# Patient Record
Sex: Female | Born: 1970 | Race: White | Hispanic: No | Marital: Married | Smoking: Never smoker
Health system: Southern US, Community
[De-identification: ages and names within clinical notes are randomized; demographics above are authoritative.]

## PROBLEM LIST (undated history)

## (undated) DIAGNOSIS — Z87442 Personal history of urinary calculi: Secondary | ICD-10-CM

## (undated) DIAGNOSIS — G43909 Migraine, unspecified, not intractable, without status migrainosus: Secondary | ICD-10-CM

## (undated) HISTORY — PX: OTHER SURGICAL HISTORY: SHX169

## (undated) HISTORY — DX: Migraine, unspecified, not intractable, without status migrainosus: G43.909

## (undated) HISTORY — PX: TONSILLECTOMY: SUR1361

---

## 2006-11-04 ENCOUNTER — Ambulatory Visit: Payer: Self-pay

## 2006-11-05 ENCOUNTER — Ambulatory Visit: Payer: Self-pay

## 2007-05-26 ENCOUNTER — Ambulatory Visit: Payer: Self-pay

## 2008-05-02 ENCOUNTER — Emergency Department: Payer: Self-pay | Admitting: Emergency Medicine

## 2011-04-28 ENCOUNTER — Emergency Department: Payer: Self-pay | Admitting: Emergency Medicine

## 2011-05-17 ENCOUNTER — Emergency Department: Payer: Self-pay | Admitting: *Deleted

## 2011-07-23 ENCOUNTER — Emergency Department: Payer: Self-pay | Admitting: Emergency Medicine

## 2012-10-21 ENCOUNTER — Emergency Department: Payer: Self-pay | Admitting: Emergency Medicine

## 2012-10-21 LAB — RAPID INFLUENZA A&B ANTIGENS

## 2015-07-27 ENCOUNTER — Other Ambulatory Visit: Payer: Self-pay | Admitting: Obstetrics and Gynecology

## 2015-07-27 DIAGNOSIS — Z1231 Encounter for screening mammogram for malignant neoplasm of breast: Secondary | ICD-10-CM

## 2015-08-15 ENCOUNTER — Ambulatory Visit
Admission: RE | Admit: 2015-08-15 | Discharge: 2015-08-15 | Disposition: A | Discharge status: Home / Self Care | Payer: BLUE CROSS/BLUE SHIELD | Source: Ambulatory Visit | Attending: Obstetrics and Gynecology | Admitting: Obstetrics and Gynecology

## 2015-08-15 DIAGNOSIS — Z1231 Encounter for screening mammogram for malignant neoplasm of breast: Secondary | ICD-10-CM

## 2015-11-29 ENCOUNTER — Other Ambulatory Visit: Payer: Self-pay | Admitting: Obstetrics and Gynecology

## 2015-11-29 DIAGNOSIS — M533 Sacrococcygeal disorders, not elsewhere classified: Secondary | ICD-10-CM

## 2015-12-06 ENCOUNTER — Other Ambulatory Visit: Payer: Self-pay | Admitting: Family Medicine

## 2015-12-06 DIAGNOSIS — M5441 Lumbago with sciatica, right side: Secondary | ICD-10-CM

## 2015-12-06 DIAGNOSIS — M533 Sacrococcygeal disorders, not elsewhere classified: Secondary | ICD-10-CM

## 2015-12-07 ENCOUNTER — Other Ambulatory Visit: Payer: Self-pay | Admitting: Obstetrics and Gynecology

## 2015-12-07 DIAGNOSIS — R102 Pelvic and perineal pain: Secondary | ICD-10-CM

## 2015-12-10 ENCOUNTER — Ambulatory Visit
Admission: RE | Admit: 2015-12-10 | Discharge: 2015-12-10 | Disposition: A | Discharge status: Home / Self Care | Payer: BLUE CROSS/BLUE SHIELD | Source: Ambulatory Visit | Attending: Obstetrics and Gynecology | Admitting: Obstetrics and Gynecology

## 2015-12-10 DIAGNOSIS — D259 Leiomyoma of uterus, unspecified: Secondary | ICD-10-CM | POA: Diagnosis not present

## 2015-12-10 DIAGNOSIS — R102 Pelvic and perineal pain: Secondary | ICD-10-CM | POA: Insufficient documentation

## 2015-12-11 ENCOUNTER — Ambulatory Visit (HOSPITAL_COMMUNITY): Payer: BLUE CROSS/BLUE SHIELD

## 2015-12-11 ENCOUNTER — Other Ambulatory Visit (HOSPITAL_COMMUNITY): Payer: BLUE CROSS/BLUE SHIELD

## 2015-12-17 ENCOUNTER — Ambulatory Visit: Payer: BLUE CROSS/BLUE SHIELD

## 2015-12-17 ENCOUNTER — Ambulatory Visit (HOSPITAL_COMMUNITY): Payer: BLUE CROSS/BLUE SHIELD

## 2015-12-18 ENCOUNTER — Other Ambulatory Visit (HOSPITAL_COMMUNITY): Payer: Self-pay | Admitting: Obstetrics and Gynecology

## 2015-12-18 ENCOUNTER — Ambulatory Visit (HOSPITAL_COMMUNITY): Payer: BLUE CROSS/BLUE SHIELD

## 2015-12-18 ENCOUNTER — Ambulatory Visit (HOSPITAL_COMMUNITY)
Admission: RE | Admit: 2015-12-18 | Discharge: 2015-12-18 | Disposition: A | Discharge status: Home / Self Care | Payer: BLUE CROSS/BLUE SHIELD | Source: Ambulatory Visit | Attending: Family Medicine | Admitting: Family Medicine

## 2015-12-18 DIAGNOSIS — M5126 Other intervertebral disc displacement, lumbar region: Secondary | ICD-10-CM | POA: Diagnosis not present

## 2015-12-18 DIAGNOSIS — M5127 Other intervertebral disc displacement, lumbosacral region: Secondary | ICD-10-CM | POA: Diagnosis not present

## 2015-12-18 DIAGNOSIS — M5441 Lumbago with sciatica, right side: Secondary | ICD-10-CM

## 2015-12-18 DIAGNOSIS — R102 Pelvic and perineal pain: Secondary | ICD-10-CM | POA: Insufficient documentation

## 2015-12-18 DIAGNOSIS — M533 Sacrococcygeal disorders, not elsewhere classified: Secondary | ICD-10-CM

## 2015-12-26 ENCOUNTER — Ambulatory Visit: Payer: BLUE CROSS/BLUE SHIELD

## 2016-01-02 ENCOUNTER — Encounter: Payer: Self-pay | Admitting: Physical Therapy

## 2016-01-02 ENCOUNTER — Ambulatory Visit: Payer: BLUE CROSS/BLUE SHIELD | Attending: Family Medicine | Admitting: Physical Therapy

## 2016-01-02 DIAGNOSIS — M7989 Other specified soft tissue disorders: Secondary | ICD-10-CM | POA: Insufficient documentation

## 2016-01-02 DIAGNOSIS — R279 Unspecified lack of coordination: Secondary | ICD-10-CM

## 2016-01-02 DIAGNOSIS — M6281 Muscle weakness (generalized): Secondary | ICD-10-CM | POA: Diagnosis not present

## 2016-01-02 NOTE — Patient Instructions (Addendum)
    Increasing water from 1 glass to 2 glasses a day this week  Body mechanics with house hold chores and log rollign out of bed to decrease strain on back (handout)

## 2016-01-03 NOTE — Therapy (Signed)
[place] MAIN [facility] SERVICES [address] [place], [place], [address] Phone: [phone]   Fax:  [phone]  Physical Therapy Evaluation  Patient Details  Name: Autumn Chang MRN: [id number] Date of Birth: [birth year] Referring Provider: Netty Starring  Encounter Date: 01/02/2016      PT End of Session - 01/02/16 1459    Visit Number 1   Number of Visits 12   Date for PT Re-Evaluation 03/26/16   PT Start Time 1300   PT Stop Time 1402   PT Time Calculation (min) 62 min   Activity Tolerance Patient tolerated treatment well;No increased pain   Behavior During Therapy [facility] for tasks assessed/performed      Past Medical History  Diagnosis Date  . Migraines     Past Surgical History  Procedure Laterality Date  . Cesarean section  1999, 2004    2 c-sections   . Toncillectomy      There were no vitals filed for this visit.       Subjective Assessment - 01/03/16 2212    Subjective 1) Pt reports she had a fall flat on her back from stairs in Sept 2016 without noted injury at the time. In Nov 2016, pt started to experience worsening of LBP and she was not able to differentiate between LBP or her period pain.The pain limited pt from activities such as lifting laundry, standing to do dishes 8/10 with radiating pain to back of calf  at times. This level of pain decreased 0/10 after pt started taking prednisone 2 weeks ago and pt has been able to sleep better and return to household activities. MRI finding 2 weeks ago showed "slipped disk" L5 and x-ray showed "arthritis".  2) Pelvic foor dysfunction: constipation every 3 days with Stool type 4.  Denied straining.  Pt noticed more constipation with use of steriod medications. Pt states she is "not drinking enough water". Pt drinks 1 glass of water per day and 2 glasses/ day of tea, and 1 can of Sprite 1x day/ 5 days  week.       Pertinent History  Occupation: Materials engineer.  Gynecological Hx: 2 c-sections  with sever LBP during L & D.  Denied prior LBP in life. Hx of falls: 04/2015, and 2014 on the same set of steps at her home. Both falls involved her back hitting her steps.  Pt stated "she is scared of stairs"  Pt uses hand rails now.    Patient Stated Goals Education on what to do to decrease pain and return to household chores             [facility] PT Assessment - 01/03/16 2203    Assessment   Medical Diagnosis LBP with R sciatica   Referring Provider Linthavong   Precautions   Precautions None   Restrictions   Weight Bearing Restrictions No   Balance Screen   Has the patient fallen in the past 6 months No   Home Environment   Additional Comments 1 story with 8 front porch steps with rail   Observation/Other Assessments   Observations increase anterior tilt of pelvis with heel weightbearing hyperextended knees   Other Surveys  --  ODI 4% , LEFS 24%    Other:   Other/ Comments spinal flexion with sweeping. vaccuuming, lifting, (post Tx, demo'd proper body mechanics)    Posture/Postural Control   Posture Comments lumbopelvic instability with hip flexion   Strength   Overall Strength Comments hip abd B 4/5  Palpation   SI assessment  R PSIS more posterior, L ASIS more anterior  (more symetrical post Tx)   Palpation comment increased scar restriction along lower abdominal scar (R > L)    Bed Mobility   Bed Mobility --  crunching method (able to log roll)                   [facility] Adult PT Treatment/Exercise - 01/03/16 2203    Therapeutic Activites    Therapeutic Activities --  see pt instructions   Neuro Re-ed    Neuro Re-ed Details  see pt instructions    Manual Therapy   Manual therapy comments PA mob along L PSIS, rotational mob, MET with L hip abd / ER                 PT Education - 01/02/16 1459    Education provided Yes   Education Details POC, anatomy, physiology, goals, HEP   Person(s) Educated Patient   Methods  Explanation;Demonstration;Tactile cues;Verbal cues;Handout   Comprehension Returned demonstration;Verbalized understanding             PT Long Term Goals - 01/03/16 2213    PT LONG TERM GOAL #1   Title Pt will demo proper stairclimbing/descent technique across 1 flight of stairs with unilateral UE support on rail  without verbal cuing to minimize falls   Time 12   Period Weeks   Status New   PT LONG TERM GOAL #2   Title Pt will decrease her ODI score from 4% to < 2% in order to return to ADLs.    Time 12   Period Weeks   Status New   PT LONG TERM GOAL #3   Title Pt will demo decreased scar restrictions over abdomen and no pelvic girdle asymmetries across 2 visits in order to progress to deep core strengthening to minimize relapse of Sx.    Time 12   Period Weeks   Status New   PT LONG TERM GOAL #4   Title Pt will demo proper standing/sitting and household chore body mechanics in order to minimize reinjury to spine and decrease radiating pain.    Time 12   Period Weeks   Status New   PT LONG TERM GOAL #5   Title Pt will demo increased lumbopelvic stability with deep core level 1-3 exercises 5 reps in order to improve postural stability for dynamic balance activities such as stairclmibing/descent.   Time 12   Period Weeks   Status New               Plan - 01/03/16 2206    Clinical Impression Statement Pt is a 45 yo female who c/o radiating CLBP and constipation. Pt's deficits impact her ability to perform household chores as a homemaker. Pt's clinical presentations include scar restrictions over low abdomen, pelvic obliquities, poor coordination of deep core mm, poor posture and body mechanics with functional activities. Patient will benefit from skilled therapeutic intervention in order to improve the following deficits and impairments:  Decreased coordination, Difficulty walking, Increased fascial restricitons, Decreased endurance, Decreased safety awareness, Pain,  Decreased balance, Decreased strength, Decreased mobility, Decreased activity tolerance, Obesity, Increased muscle spasms, Hypomobility, Decreased scar mobility, Improper body mechanics, Hypermobility, Postural dysfunction   Rehab Potential Good   Clinical Impairments Affecting Rehab Potential 2 c-sections (scar ahdesions), pelvic floor dysfunction, poor water intake, Hx of falls   PT Frequency 1x / week   PT Duration 12 weeks  PT Treatment/Interventions ADLs/Self Care Home Management;Aquatic Therapy;Gait training;Stair training;Cryotherapy;Traction;Moist Heat;Functional mobility training;Therapeutic activities;Therapeutic exercise;Balance training;Neuromuscular re-education;Manual lymph drainage;Manual techniques;Patient/family education;Scar mobilization;Energy conservation;Taping   Consulted and Agree with Plan of Care Patient      Patient will benefit from skilled therapeutic intervention in order to improve the following deficits and impairments:  Decreased coordination, Difficulty walking, Increased fascial restricitons, Decreased endurance, Decreased safety awareness, Pain, Decreased balance, Decreased strength, Decreased mobility, Decreased activity tolerance, Obesity, Increased muscle spasms, Hypomobility, Decreased scar mobility, Improper body mechanics, Hypermobility, Postural dysfunction  Visit Diagnosis: Muscle weakness (generalized)  Other specified soft tissue disorders  Unspecified lack of coordination     Problem List There are no active problems to display for this patient.   Jerl Mina ,PT, DPT, E-RYT  01/03/2016, 10:17 PM  [place] MAIN [facility] SERVICES [address] [place], [place], [address] Phone: [phone]   Fax:  [phone]  Name: Autumn Chang MRN: [id number] Date of Birth: [birth year]

## 2016-01-09 ENCOUNTER — Ambulatory Visit: Payer: BLUE CROSS/BLUE SHIELD | Admitting: Physical Therapy

## 2016-01-15 ENCOUNTER — Ambulatory Visit: Payer: BLUE CROSS/BLUE SHIELD | Attending: Family Medicine | Admitting: Physical Therapy

## 2016-01-15 DIAGNOSIS — M6281 Muscle weakness (generalized): Secondary | ICD-10-CM

## 2016-01-15 DIAGNOSIS — M7989 Other specified soft tissue disorders: Secondary | ICD-10-CM | POA: Diagnosis present

## 2016-01-15 DIAGNOSIS — R279 Unspecified lack of coordination: Secondary | ICD-10-CM

## 2016-01-15 NOTE — Patient Instructions (Addendum)
DEEP CORE STRENGTHENING  You are now ready to begin training the deep core muscles system: diaphragm, transverse abdominis, pelvic floor . These muscles must work together as a team.     The key to these exercises to train the brain to coordinate the timing of these muscles and to have them turn on for long periods of time to hold you upright against gravity (especially important if you are on your feet all day).These muscles are postural muscles and play a role stabilizing your spine and bodyweight. By doing these repetitions slowly and correctly instead of doing crunches, you will achieve a flatter belly without a lower pooch. You are also placing your spine in a more neutral position and breathing properly which in turn, decreases your risk for problems related to your pelvic floor, abdominal, and low back such as pelvic organ prolapse, hernias, diastasis recti (separation of superficial muscles), disk herniations, spinal fractures. These exercises set a solid foundation for you to later progress to resistance/ strength training with therabands and weights and return to other typical fitness exercises with a stronger deeper core.  Do only level 2 (30 reps x 2/ day)     STRETCHES:   Frog stretch:  For pelvic floor muscle  10 x 3  Lay on belly, knees are hip with, inhale do nothing,  Exhale, drop feet away from each other ~45 deg    Child's pose rocking: For your back  5 x    ____________  Dew[place] UP ITEMS:   Mini squat to pick up items      STANDING:  Maintain 50% weight in each leg Avoid standing w/ weight shifted to one leg

## 2016-01-16 ENCOUNTER — Encounter: Payer: BLUE CROSS/BLUE SHIELD | Admitting: Physical Therapy

## 2016-01-16 NOTE — Therapy (Addendum)
[place] MAIN [facility] SERVICES [address] [place], [place], [address] Phone: [phone]   Fax:  [phone]  Physical Therapy Treatment  Patient Details  Name: Autumn Chang MRN: [id number] Date of Birth: 08/27/1970 Referring Provider: Netty Starring  Encounter Date: 01/15/2016      PT End of Session - 01/16/16 0940    Visit Number 2   Number of Visits 12   Date for PT Re-Evaluation 03/26/16   PT Start Time 1600   PT Stop Time [id number]   PT Time Calculation (min) 58 min   Activity Tolerance Patient tolerated treatment well;No increased pain   Behavior During Therapy [facility] for tasks assessed/performed      Past Medical History  Diagnosis Date  . Migraines     Past Surgical History  Procedure Laterality Date  . Cesarean section  1999, 2004    2 c-sections   . Toncillectomy      There were no vitals filed for this visit.      Subjective Assessment - 01/15/16 1608    Subjective Last week, pt experienced a relapse of her R sciatic pain which was localized as a mm spasm on posterior thigh and radiated to back of her knee. Pt was prescribed gabapentin which helped her "a little bit" and Dr. Netty Starring has referred her to get a shot in her back 02/18/16. Pt is concerned that she also felt numbness in her R foot 3 days ago and also R hand also felt numb like carpel tunnel a couple times this week. Pt had performed lots of bending down and lifting small things prior to the onset of this relapse of pain.  Pt denied weakness in her hand nor foot.     Pertinent History  Occupation: Materials engineer.  Gynecological Hx: 2 c-sections with sever LBP during L & D.  Denied prior LBP in life. Hx of falls: 04/2015, and 2014 on the same set of steps at her home. Both falls involved her back hitting her steps.  Pt stated "she is scared of stairs"  Pt uses hand rails now.    Patient Stated Goals Education on what to do to decrease pain and return to household chores              [facility] PT Assessment - 01/15/16 1638    Observation/Other Assessments   Observations pre-Tx: L shoulder higher > R, post-Tx: shoulders symmetrical.     PROM   Overall PROM Comments pre-Tx: hip IR on R ~10, L ~20, Post-Tx: R hip IR ~20 deg   Palpation   SI assessment  R PSIS tender and lower than L pre Tx, symmetrical post Tx : no tenderness nor radiating pain on R posterior thigh   Palpation comment R obt internus/ ext w/ tenderness and tensions,    other    Length pre-Tx: R 34 in, L 35 in   Left  --  Pre-Tx: L higher in long sit, post-Tx: symmetrical    Comments --  Post-Tx: 34.5" bilaterally                     [facility] Adult PT Treatment/Exercise - 01/16/16 0942    Therapeutic Activites    Therapeutic Activities --  see pt instructions, body mechanics to lifting/bending   Neuro Re-ed    Neuro Re-ed Details  standing posture and deep core coordination exercise   Manual Therapy   Manual therapy comments R rotational mob on R ilium, STM  release on R obt int/ext in hooklying, L midback mm release STM                PT Education - 01/16/16 0940    Education provided Yes   Education Details HEP, proper body mechanics   Person(s) Educated Patient   Methods Explanation;Demonstration;Tactile cues;Verbal cues;Handout   Comprehension Returned demonstration;Verbalized understanding             PT Long Term Goals - 01/03/16 2213    PT LONG TERM GOAL #1   Title Pt will demo proper stairclimbing/descent technique across 1 flight of stairs with unilateral UE support on rail  without verbal cuing to minimize falls   Time 12   Period Weeks   Status New   PT LONG TERM GOAL #2   Title Pt will decrease her ODI score from 4% to < 2% in order to return to ADLs.    Time 12   Period Weeks   Status New   PT LONG TERM GOAL #3   Title Pt will demo decreased scar restrictions over abdomen and no pelvic girdle asymmetries across 2 visits in order to progress  to deep core strengthening to minimize relapse of Sx.    Time 12   Period Weeks   Status New   PT LONG TERM GOAL #4   Title Pt will demo proper standing/sitting and household chore body mechanics in order to minimize reinjury to spine and decrease radiating pain.    Time 12   Period Weeks   Status New   PT LONG TERM GOAL #5   Title Pt will demo increased lumbopelvic stability with deep core level 1-3 exercises 5 reps in order to improve postural stability for dynamic balance activities such as stairclmibing/descent.   Time 12   Period Weeks   Status New               Plan - 01/15/16 1656    Clinical Impression Statement After Tx today, pt reported decreased pain from 2/10 to 0/10 pain without any radiating pain to her posterior knee. Pt showed increased mm tensions / tenderness at her R obturator internus/ externus mm associated with limited passive hip IR (hip flexion/ knee flexion 90-90) position. This was the likely cause for her presentation of an elevated iliac crest and shoulder on her L side, more posterior L PSIS, leg length differences, and antalgic gait. Pt responded to external manual Tx which corrected all of her musculoskeletal asymmetries. She demonstrated a more normal gait pattern at the end of the session.  Initiated deep core strengthening exercises and body mechanics training with standing posture and bending/lifting. Pt demo'd correctly.  Pt will continue to benefit from skilled PT to minimize relapse of her pain. Plan to address her scar adhesions from 2 c-sections at next session and progress deep core strengthening exercises.    Rehab Potential Good   Clinical Impairments Affecting Rehab Potential 2 c-sections (scar ahdesions), pelvic floor dysfunction, poor water intake, Hx of falls     PT Frequency 1x / week   PT Duration 12 weeks   PT Treatment/Interventions ADLs/Self Care Home Management;Aquatic Therapy;Gait training;Stair training;Cryotherapy;Traction;Moist  Heat;Functional mobility training;Therapeutic activities;Therapeutic exercise;Balance training;Neuromuscular re-education;Manual lymph drainage;Manual techniques;Patient/family education;Scar mobilization;Energy conservation;Taping   Consulted and Agree with Plan of Care Patient      Patient will benefit from skilled therapeutic intervention in order to improve the following deficits and impairments:  Decreased coordination, Difficulty walking, Increased fascial restricitons, Decreased endurance, Decreased safety  awareness, Pain, Decreased balance, Decreased strength, Decreased mobility, Decreased activity tolerance, Obesity, Increased muscle spasms, Hypomobility, Decreased scar mobility, Improper body mechanics, Hypermobility, Postural dysfunction  Visit Diagnosis: Muscle weakness (generalized)  Other specified soft tissue disorders  Unspecified lack of coordination     Problem List There are no active problems to display for this patient.   Jerl Mina ,PT, DPT, E-RYT  01/16/2016, 9:43 AM  [place] MAIN [facility] SERVICES [address] [place], [place], [address] Phone: [phone]   Fax:  [phone]  Name: Autumn Chang MRN: [id number] Date of Birth: 29-Aug-1970

## 2016-01-17 ENCOUNTER — Encounter: Payer: BLUE CROSS/BLUE SHIELD | Admitting: Physical Therapy

## 2016-01-22 ENCOUNTER — Encounter: Payer: BLUE CROSS/BLUE SHIELD | Admitting: Physical Therapy

## 2016-01-24 ENCOUNTER — Ambulatory Visit: Payer: BLUE CROSS/BLUE SHIELD | Admitting: Physical Therapy

## 2016-01-24 DIAGNOSIS — M6281 Muscle weakness (generalized): Secondary | ICD-10-CM | POA: Diagnosis not present

## 2016-01-24 DIAGNOSIS — M7989 Other specified soft tissue disorders: Secondary | ICD-10-CM

## 2016-01-24 DIAGNOSIS — R279 Unspecified lack of coordination: Secondary | ICD-10-CM

## 2016-01-24 NOTE — Therapy (Addendum)
[place] MAIN [facility] SERVICES [address] [place], [place], [address] Phone: [phone]   Fax:  [phone]  Physical Therapy Treatment  Patient Details  Name: Autumn Chang MRN: [id number] Date of Birth: 04/27/1971 Referring Provider: Netty Starring  Encounter Date: 01/24/2016      PT End of Session - 01/24/16 1421    Visit Number 3   Number of Visits 12   Date for PT Re-Evaluation 03/26/16   PT Start Time 1330   PT Stop Time 1425   PT Time Calculation (min) 55 min   Activity Tolerance Patient tolerated treatment well;No increased pain   Behavior During Therapy [facility] for tasks assessed/performed      Past Medical History  Diagnosis Date  . Migraines     Past Surgical History  Procedure Laterality Date  . Cesarean section  1999, 2004    2 c-sections   . Toncillectomy      There were no vitals filed for this visit.      Subjective Assessment - 01/24/16 1337    Subjective Pt reported 70% improvement with her sciatic pain following last treatment and has returned to being able to sit and stand and perform household chores.  Soreness sensation (not calling it pain) remains at the glut region on R. Pt did experience  "bone on bone" sensation in teh low back which kept her in bed all day but dissipated the next day after taking pain medications. Pt stated she has had cramping in her legs during the night since taking a new medicatin and she plans to inform her MD.  Pt feels her walking is better.    Pertinent History  Occupation: Materials engineer.  Gynecological Hx: 2 c-sections with sever LBP during L & D.  Denied prior LBP in life. Hx of falls: 04/2015, and 2014 on the same set of steps at her home. Both falls involved her back hitting her steps.  Pt stated "she is scared of stairs"  Pt uses hand rails now.    Patient Stated Goals Education on what to do to decrease pain and return to household chores             [facility] PT Assessment -  01/24/16 1342    Observation/Other Assessments   Observations both shoulders at equal height   Posture/Postural Control   Posture Comments required minor cuing for level 2 exercise   PROM   Overall PROM Comments preTx: Hip IR R ~15 deg, L ~25 deg, post Tx R hip IR ~25 deg   Palpation   SI assessment  symmetry of PSIS L/R , no tenderness nor tensions at previous areas   Palpation comment Minor increased tensions at R obt int, increased fascial restriction at suprapubic scar    Ambulation/Gait   Gait Comments no gait deviations noted today                     [facility] Adult PT Treatment/Exercise - 01/24/16 1342    Therapeutic Activites    Therapeutic Activities --  see pt instruction   Neuro Re-ed    Neuro Re-ed Details  see pt instructions   Manual Therapy   Manual therapy comments manual lymph drainage over abdomen, scar mobilization, fascial releases   sustained pressure at R obt int                 PT Education - 01/24/16 1421    Education provided Yes   Education Details  HEP   Person(s) Educated Patient   Methods Explanation;Demonstration;Tactile cues;Verbal cues;Handout   Comprehension Returned demonstration;Verbalized understanding             PT Long Term Goals - 01/24/16 1424    PT LONG TERM GOAL #1   Title Pt will demo proper stairclimbing/descent technique across 1 flight of stairs with unilateral UE support on rail  without verbal cuing to minimize falls   Time 12   Period Weeks   Status On-going   PT LONG TERM GOAL #2   Title Pt will decrease her ODI score from 4% to < 2% in order to return to ADLs.    Time 12   Period Weeks   Status On-going   PT LONG TERM GOAL #3   Title Pt will demo decreased scar restrictions over abdomen and no pelvic girdle asymmetries across 2 visits in order to progress to deep core strengthening to minimize relapse of Sx.    Time 12   Period Weeks   Status Partially Met   PT LONG TERM GOAL #4   Title Pt  will demo proper standing/sitting and household chore body mechanics in order to minimize reinjury to spine and decrease radiating pain.    Time 12   Period Weeks   Status Achieved   PT LONG TERM GOAL #5   Title Pt will demo increased lumbopelvic stability with deep core level 1-3 exercises 5 reps in order to improve postural stability for dynamic balance activities such as stairclmibing/descent.   Time 12   Period Weeks   Status On-going               Plan - 01/24/16 1421    Clinical Impression Statement Pt reported she had not had any sciatic Sx to her R foot the past week after last session and felt 70% improvement as she has returned to being able to sit, stand, and perform household chores. Pt showed more symmetrical alignment in her pelvic girdle and decreased pelvic floor mm tensions today. Following Tx todday, pt achieved more hip IR PROM equal to L.  Suspect pt's LBP was caused by tight pelvic floor mm (obturator internus. externus), C-section scar adhesions and associated pelvic obliquities. Pt continues to make good progress towards her goals. Her next 2 sessions will utilize aquatic therapy for strengthening because she has a pool at her house and pt is interested in this mode of exercise.    Rehab Potential Good   Clinical Impairments Affecting Rehab Potential 2 c-sections (scar ahdesions), pelvic floor dysfunction, poor water intake, Hx of falls     PT Frequency 1x / week   PT Duration 12 weeks   PT Treatment/Interventions ADLs/Self Care Home Management;Aquatic Therapy;Gait training;Stair training;Cryotherapy;Traction;Moist Heat;Functional mobility training;Therapeutic activities;Therapeutic exercise;Balance training;Neuromuscular re-education;Manual lymph drainage;Manual techniques;Patient/family education;Scar mobilization;Energy conservation;Taping   Consulted and Agree with Plan of Care Patient      Patient will benefit from skilled therapeutic intervention in order to  improve the following deficits and impairments:  Decreased coordination, Difficulty walking, Increased fascial restricitons, Decreased endurance, Decreased safety awareness, Pain, Decreased balance, Decreased strength, Decreased mobility, Decreased activity tolerance, Obesity, Increased muscle spasms, Hypomobility, Decreased scar mobility, Improper body mechanics, Hypermobility, Postural dysfunction  Visit Diagnosis:  Muscle weakness (generalized)  Other specified soft tissue disorders  Unspecified lack of coordination     Problem List There are no active problems to display for this patient.   Jerl Mina ,PT, DPT, E-RYT  01/24/2016, 2:43 PM  [place]  [place] MAIN [facility] SERVICES [place], [place], [address] Phone: [phone]   Fax:  [phone]  Name: Autumn Chang MRN: [id number] Date of Birth: [birth year]

## 2016-01-24 NOTE — Patient Instructions (Addendum)
  1. Deep core level 2 instead of level 1 30 reps 2x day   2. Scar massage  5 min per night with olive , coconut oil  Light pressure, zig zag along one end to other    3.  One stretch out of the 3 stretches from last visit   4 Log rolling out of bed instead of crunching up   5. When standing at kitchen waiting, 5 mini squats (knees behind toes) inhale down, exhale up

## 2016-01-29 ENCOUNTER — Ambulatory Visit: Payer: BLUE CROSS/BLUE SHIELD | Admitting: Physical Therapy

## 2016-01-29 DIAGNOSIS — M6281 Muscle weakness (generalized): Secondary | ICD-10-CM

## 2016-01-29 DIAGNOSIS — R279 Unspecified lack of coordination: Secondary | ICD-10-CM

## 2016-01-29 DIAGNOSIS — M7989 Other specified soft tissue disorders: Secondary | ICD-10-CM

## 2016-01-30 NOTE — Therapy (Addendum)
[place] MAIN [facility] SERVICES [address] [place], [place], [address] Phone: [phone]   Fax:  [phone]  Physical Therapy Treatment  Patient Details  Name: Autumn Chang MRN: [id number] Date of Birth: [birth year] Referring Provider: Netty Starring  Encounter Date: 01/29/2016      PT End of Session - 01/30/16 1813    Visit Number 4   Number of Visits 12   Date for PT Re-Evaluation 03/26/16   PT Start Time 1572   PT Stop Time 1225   PT Time Calculation (min) 40 min   Activity Tolerance Patient tolerated treatment well;No increased pain   Behavior During Therapy [facility] for tasks assessed/performed      Past Medical History  Diagnosis Date  . Migraines     Past Surgical History  Procedure Laterality Date  . Cesarean section  1999, 2004    2 c-sections   . Toncillectomy      There were no vitals filed for this visit.      Subjective Assessment - 01/30/16 1758    Subjective Pt reported she has not had the radiating pain nor LBP for the past 1.5 week. Pt thanked PT.   Pertinent History  Occupation: Materials engineer.  Gynecological Hx: 2 c-sections with sever LBP during L & D.  Denied prior LBP in life. Hx of falls: 04/2015, and 2014 on the same set of steps at her home. Both falls involved her back hitting her steps.  Pt stated "she is scared of stairs"  Pt uses hand rails now.    Patient Stated Goals Education on what to do to decrease pain and return to household chores                      Adult Aquatic Therapy - 01/30/16 1800    Aquatic Therapy Subjective   Subjective Pt had no complaints      O: Pt entered/exited the pool via steps with single UE support on rail.  50 ft =1 lap  Exercises performed in 3'6" depth  2 laps: walking forward and backward w/ blue pool floor for proprioception 2 laps sideways walking with mini squat R only Step ups (lateral) with propioception training 10' Right side leading  2 laps noodle  behind back for tricep pushdowns 2 laps backward walking seated on noodle  2 laps support by noodles UE/LE  abd/add   Exercises performed in 4'6" depth   2 laps [place] walking with noodle at armpits forward and backward  Exercises performed in 3'6" depth  stretches: Quad, hamstring with noodle Hip flexor,  Hip flexor with twist     relaxation seated               PT Long Term Goals - 01/24/16 1424    PT LONG TERM GOAL #1   Title Pt will demo proper stairclimbing/descent technique across 1 flight of stairs with unilateral UE support on rail  without verbal cuing to minimize falls   Time 12   Period Weeks   Status On-going   PT LONG TERM GOAL #2   Title Pt will decrease her ODI score from 4% to < 2% in order to return to ADLs.    Time 12   Period Weeks   Status On-going   PT LONG TERM GOAL #3   Title Pt will demo decreased scar restrictions over abdomen and no pelvic girdle asymmetries across 2 visits in order to progress to deep core strengthening  to minimize relapse of Sx.    Time 12   Period Weeks   Status Partially Met   PT LONG TERM GOAL #4   Title Pt will demo proper standing/sitting and household chore body mechanics in order to minimize reinjury to spine and decrease radiating pain.    Time 12   Period Weeks   Status Achieved   PT LONG TERM GOAL #5   Title Pt will demo increased lumbopelvic stability with deep core level 1-3 exercises 5 reps in order to improve postural stability for dynamic balance activities such as stairclmibing/descent.   Time 12   Period Weeks   Status On-going               Plan - 01/30/16 1814    Clinical Impression Statement Pt continues to report no radiating pain. Pt tolerated her aquatic therapy session without complaints. Focused exercises on propioception, stretching, and thoracolumbar strengthening with deep core coordination. Pt required moderate cuing with propioception training.  Pt will continue to benefit  from skilled PT.    Rehab Potential Good   Clinical Impairments Affecting Rehab Potential 2 c-sections (scar ahdesions), pelvic floor dysfunction, poor water intake, Hx of falls     PT Frequency 1x / week   PT Duration 12 weeks   PT Treatment/Interventions ADLs/Self Care Home Management;Aquatic Therapy;Gait training;Stair training;Cryotherapy;Traction;Moist Heat;Functional mobility training;Therapeutic activities;Therapeutic exercise;Balance training;Neuromuscular re-education;Manual lymph drainage;Manual techniques;Patient/family education;Scar mobilization;Energy conservation;Taping   Consulted and Agree with Plan of Care Patient      Patient will benefit from skilled therapeutic intervention in order to improve the following deficits and impairments:  Decreased coordination, Difficulty walking, Increased fascial restricitons, Decreased endurance, Decreased safety awareness, Pain, Decreased balance, Decreased strength, Decreased mobility, Decreased activity tolerance, Obesity, Increased muscle spasms, Hypomobility, Decreased scar mobility, Improper body mechanics, Hypermobility, Postural dysfunction  Visit Diagnosis: Muscle weakness (generalized)  Other specified soft tissue disorders  Unspecified lack of coordination      Problem List There are no active problems to display for this patient.   Jerl Mina ,PT, DPT, E-RYT  01/30/2016, 6:16 PM  [place] MAIN [facility] SERVICES [address] [place], [place], [address] Phone: [phone]   Fax:  [phone]  Name: Autumn Chang MRN: [id number] Date of Birth: Feb 15, 1971

## 2016-02-07 ENCOUNTER — Ambulatory Visit: Payer: BLUE CROSS/BLUE SHIELD | Admitting: Physical Therapy

## 2016-02-07 DIAGNOSIS — M6281 Muscle weakness (generalized): Secondary | ICD-10-CM | POA: Diagnosis not present

## 2016-02-07 DIAGNOSIS — R279 Unspecified lack of coordination: Secondary | ICD-10-CM

## 2016-02-07 DIAGNOSIS — M7989 Other specified soft tissue disorders: Secondary | ICD-10-CM

## 2016-02-08 NOTE — Therapy (Addendum)
[place] MAIN [facility] SERVICES [address] [place], [place], [address] Phone: [phone]   Fax:  [phone]  Physical Therapy Treatment  Patient Details  Name: Autumn Chang MRN: [id number] Date of Birth: August 28, 1970 Referring Provider: Netty Starring  Encounter Date: 02/07/2016      PT End of Session - 02/08/16 1500    Visit Number 5   Number of Visits 12   Date for PT Re-Evaluation 03/26/16   PT Start Time 1130   PT Stop Time 6160   PT Time Calculation (min) 35 min   Activity Tolerance Patient tolerated treatment well;No increased pain   Behavior During Therapy [facility] for tasks assessed/performed      Past Medical History  Diagnosis Date  . Migraines     Past Surgical History  Procedure Laterality Date  . Cesarean section  1999, 2004    2 c-sections   . Toncillectomy      There were no vitals filed for this visit.      Subjective Assessment - 02/08/16 1459    Subjective pt reported she continues to not have any pain with activities. Pt notices a bone on bone sensation when she turns over in bed in the middle of the night to her R side.    Pertinent History  Occupation: Materials engineer.  Gynecological Hx: 2 c-sections with sever LBP during L & D.  Denied prior LBP in life. Hx of falls: 04/2015, and 2014 on the same set of steps at her home. Both falls involved her back hitting her steps.  Pt stated "she is scared of stairs"  Pt uses hand rails now.    Patient Stated Goals Education on what to do to decrease pain and return to household chores                      Adult Aquatic Therapy - 02/08/16 1500    Aquatic Therapy Subjective   Subjective Pt had no complaints                         PT Long Term Goals - 01/24/16 1424    PT LONG TERM GOAL #1   Title Pt will demo proper stairclimbing/descent technique across 1 flight of stairs with unilateral UE support on rail  without verbal cuing to minimize falls    Time 12   Period Weeks   Status On-going   PT LONG TERM GOAL #2   Title Pt will decrease her ODI score from 4% to < 2% in order to return to ADLs.    Time 12   Period Weeks   Status On-going   PT LONG TERM GOAL #3   Title Pt will demo decreased scar restrictions over abdomen and no pelvic girdle asymmetries across 2 visits in order to progress to deep core strengthening to minimize relapse of Sx.    Time 12   Period Weeks   Status Partially Met   PT LONG TERM GOAL #4   Title Pt will demo proper standing/sitting and household chore body mechanics in order to minimize reinjury to spine and decrease radiating pain.    Time 12   Period Weeks   Status Achieved   PT LONG TERM GOAL #5   Title Pt will demo increased lumbopelvic stability with deep core level 1-3 exercises 5 reps in order to improve postural stability for dynamic balance activities such as stairclmibing/descent.   Time 12  Period Weeks   Status On-going               Plan - 02/08/16 1500    Clinical Impression Statement Pt continues to not have any pain with activities but has a complaint with R log rolling when sleeping which PT plans to assess at next session. Pt is progressing well with pool therapy. Initiated aerobic conditioning into pool HEP. Pt demonstrated increased alignment and propioception with forward and back ward lunges to build hip and leg strength with floor to stand transfers. Pt also demo'd improved upright posture. Pt will continue to benefit from a few more stregnth-focused appts before d/c in order to minimize relapse of pain. Decreasing frequency of visits to every other week as pt has shown significant improvement.    Rehab Potential Good   Clinical Impairments Affecting Rehab Potential 2 c-sections (scar ahdesions), pelvic floor dysfunction, poor water intake, Hx of falls     PT Frequency 1x / week   PT Duration 12 weeks   PT Treatment/Interventions ADLs/Self Care Home Management;Aquatic  Therapy;Gait training;Stair training;Cryotherapy;Traction;Moist Heat;Functional mobility training;Therapeutic activities;Therapeutic exercise;Balance training;Neuromuscular re-education;Manual lymph drainage;Manual techniques;Patient/family education;Scar mobilization;Energy conservation;Taping   Consulted and Agree with Plan of Care Patient      Patient will benefit from skilled therapeutic intervention in order to improve the following deficits and impairments:  Decreased coordination, Difficulty walking, Increased fascial restricitons, Decreased endurance, Decreased safety awareness, Pain, Decreased balance, Decreased strength, Decreased mobility, Decreased activity tolerance, Obesity, Increased muscle spasms, Hypomobility, Decreased scar mobility, Improper body mechanics, Hypermobility, Postural dysfunction  Visit Diagnosis:  Muscle weakness (generalized)  Other specified soft tissue disorders  Unspecified lack of coordination      Problem List There are no active problems to display for this patient.   Jerl Mina ,PT, DPT, E-RYT  02/08/2016, 3:04 PM  [place] MAIN [facility] SERVICES [address] [place], [place], [address] Phone: [phone]   Fax:  [phone]  Name: Autumn Chang MRN: [id number] Date of Birth: 05-Sep-1970

## 2016-02-15 ENCOUNTER — Encounter: Payer: BLUE CROSS/BLUE SHIELD | Admitting: Physical Therapy

## 2016-02-19 ENCOUNTER — Ambulatory Visit: Payer: BLUE CROSS/BLUE SHIELD | Attending: Family Medicine | Admitting: Physical Therapy

## 2016-02-19 DIAGNOSIS — M6281 Muscle weakness (generalized): Secondary | ICD-10-CM | POA: Insufficient documentation

## 2016-02-19 DIAGNOSIS — R279 Unspecified lack of coordination: Secondary | ICD-10-CM | POA: Insufficient documentation

## 2016-02-19 DIAGNOSIS — M7989 Other specified soft tissue disorders: Secondary | ICD-10-CM | POA: Insufficient documentation

## 2016-02-26 ENCOUNTER — Encounter: Payer: BLUE CROSS/BLUE SHIELD | Admitting: Physical Therapy

## 2016-03-04 ENCOUNTER — Ambulatory Visit: Payer: BLUE CROSS/BLUE SHIELD | Admitting: Physical Therapy

## 2016-03-04 DIAGNOSIS — R279 Unspecified lack of coordination: Secondary | ICD-10-CM

## 2016-03-04 DIAGNOSIS — M6281 Muscle weakness (generalized): Secondary | ICD-10-CM | POA: Diagnosis not present

## 2016-03-04 DIAGNOSIS — M7989 Other specified soft tissue disorders: Secondary | ICD-10-CM

## 2016-03-04 NOTE — Therapy (Addendum)
[place] MAIN [facility] SERVICES [address] [place], [place], [address] Phone: [phone]   Fax:  [phone]  Physical Therapy Treatment / Discharge Summary   Patient Details  Name: Autumn Chang MRN: [id number] Date of Birth: 01/30/1971 Referring Provider: Netty Starring  Encounter Date: 03/04/2016      PT End of Session - 03/04/16 1410    Visit Number 6   Number of Visits 12   Date for PT Re-Evaluation 03/26/16   PT Start Time [id number]   PT Stop Time 1405   PT Time Calculation (min) 31 min   Activity Tolerance Patient tolerated treatment well;No increased pain   Behavior During Therapy [facility] for tasks assessed/performed      Past Medical History:  Diagnosis Date  . Migraines     Past Surgical History:  Procedure Laterality Date  . [place], 2004   2 c-sections   . toncillectomy      There were no vitals filed for this visit.      Subjective Assessment - 03/04/16 1338    Subjective Pt reported 100% improvement since [facility] with return to all ADLs and traveling with family. Pt no longer has the pain when rolling in bed as well    Pertinent History  Occupation: home maker.  Gynecological Hx: 2 c-sections with sever LBP during L & D.  Denied prior LBP in life. Hx of falls: 04/2015, and 2014 on the same set of steps at her home. Both falls involved her back hitting her steps.  Pt stated "she is scared of stairs"  Pt uses hand rails now.    Patient Stated Goals Education on what to do to decrease pain and return to household chores             [facility] PT Assessment - 03/04/16 1407      Other:   Other/Comments stairclimbing with narrow BOS, floor to rise transfers (poor alignment)      Palpation   Palpation comment no facial restrictions over abdominal scars , firmness in her abdominal muscles                      [facility] Adult PT Treatment/Exercise - 03/04/16 0001      Therapeutic Activites    Therapeutic  Activities --  floor<> stand transfers/stairclimbing w/ propioception cues      Neuro Re-ed    Neuro Re-ed Details  deep core level 3 and 4 (cued for technique and objective)                 PT Education - 03/04/16 1418    Education provided Yes   Education Details HEP, stair navigation and floor to rise propioception and alignment training, recommended pt to discontinue wearing flip flops and to wear sandals with proper heel strap instead. Pt voiced understanding.     Person(s) Educated Patient   Methods Explanation;Demonstration;Tactile cues;Verbal cues;Handout   Comprehension Returned demonstration;Verbalized understanding             PT Long Term Goals - 03/04/16 1340      PT LONG TERM GOAL #1   Title Pt will demo proper stairclimbing/descent technique across 1 flight of stairs with unilateral UE support on rail  without verbal cuing to minimize falls   Time 12   Period Weeks   Status Achieved     PT LONG TERM GOAL #2   Title Pt will decrease her ODI score from 4% to <  2% in order to return to ADLs.  (0% on 03/04/16)    Time 12   Period Weeks   Status Achieved     PT LONG TERM GOAL #3   Title Pt will demo decreased scar restrictions over abdomen and no pelvic girdle asymmetries across 2 visits in order to progress to deep core strengthening to minimize relapse of Sx.    Time 12   Period Weeks   Status Achieved     PT LONG TERM GOAL #4   Title Pt will demo proper standing/sitting and household chore body mechanics in order to minimize reinjury to spine and decrease radiating pain.    Time 12   Period Weeks   Status Achieved     PT LONG TERM GOAL #5   Title Pt will demo increased lumbopelvic stability with deep core level 1-3 exercises 5 reps in order to improve postural stability for dynamic balance activities such as stairclmibing/descent.   Time 12   Period Weeks   Status Achieved               Plan - 03/04/16 1353    Clinical Impression  Statement Pt has achieved 100% of her goals across 6 visits.  Pt reports on [employer] that her low back pain has improved  "A very great deal better." She has returned to PLOF and able to perform to her house hold chores with lifting, carrying and stair navigation without pain. Pt has decreased her medication and declined a cortisone shot as she has remained compliant with her HEP. Pt benefited from manual therapy to address pelvic obliquities and abdominal scar adhesions. She also benefited from body mechanic training and deep core mm and hip/back strengthening. Pt is ready to be discharged.  Thank you for your referral!    Rehab Potential Good   Clinical Impairments Affecting Rehab Potential 2 c-sections (scar ahdesions), pelvic floor dysfunction, poor water intake, Hx of falls     PT Frequency 1x / week   PT Duration 12 weeks   PT Treatment/Interventions ADLs/Self Care Home Management;Aquatic Therapy;Gait training;Stair training;Cryotherapy;Traction;Moist Heat;Functional mobility training;Therapeutic activities;Therapeutic exercise;Balance training;Neuromuscular re-education;Manual lymph drainage;Manual techniques;Patient/family education;Scar mobilization;Energy conservation;Taping      Patient will benefit from skilled therapeutic intervention in order to improve the following deficits and impairments:  Decreased coordination, Difficulty walking, Increased fascial restricitons, Decreased endurance, Decreased safety awareness, Pain, Decreased balance, Decreased strength, Decreased mobility, Decreased activity tolerance, Obesity, Increased muscle spasms, Hypomobility, Decreased scar mobility, Improper body mechanics, Hypermobility, Postural dysfunction  Visit Diagnosis:  Muscle Weakness (generalized)   Other specified soft tissue disorders   Unspecified lack of coordination     Problem List There are no active problems to display for this patient.   Jerl Mina ,PT,  DPT, E-RYT  03/04/2016, 2:19 PM  [place] MAIN [facility] SERVICES [address] [place], [place], [address] Phone: [phone]   Fax:  [phone]  Name: Autumn Chang MRN: [id number] Date of Birth: 1971/07/31

## 2016-05-18 ENCOUNTER — Encounter: Payer: Self-pay | Admitting: Emergency Medicine

## 2016-05-18 ENCOUNTER — Emergency Department
Admission: EM | Admit: 2016-05-18 | Discharge: 2016-05-18 | Disposition: A | Discharge status: Home / Self Care | Payer: BLUE CROSS/BLUE SHIELD | Attending: Emergency Medicine | Admitting: Emergency Medicine

## 2016-05-18 DIAGNOSIS — R55 Syncope and collapse: Secondary | ICD-10-CM

## 2016-05-18 DIAGNOSIS — R531 Weakness: Secondary | ICD-10-CM | POA: Insufficient documentation

## 2016-05-18 DIAGNOSIS — N939 Abnormal uterine and vaginal bleeding, unspecified: Secondary | ICD-10-CM | POA: Diagnosis not present

## 2016-05-18 LAB — BASIC METABOLIC PANEL
ANION GAP: 9 (ref 5–15)
BUN: 15 mg/dL (ref 6–20)
CALCIUM: 9 mg/dL (ref 8.9–10.3)
CO2: 26 mmol/L (ref 22–32)
Chloride: 102 mmol/L (ref 101–111)
Creatinine, Ser: 0.93 mg/dL (ref 0.44–1.00)
GFR calc Af Amer: >60 mL/min (ref >60)
GFR calc non Af Amer: >60 mL/min (ref >60)
GLUCOSE: 130 mg/dL — AB (ref 65–99)
Potassium: 3.8 mmol/L (ref 3.5–5.1)
Sodium: 137 mmol/L (ref 135–145)

## 2016-05-18 LAB — CBC
HEMATOCRIT: 39.5 % (ref 35.0–47.0)
HEMOGLOBIN: 13.5 g/dL (ref 12.0–16.0)
MCH: 28.8 pg (ref 26.0–34.0)
MCHC: 34.1 g/dL (ref 32.0–36.0)
MCV: 84.3 fL (ref 80.0–100.0)
Platelets: 322 10*3/uL (ref 150–440)
RBC: 4.68 MIL/uL (ref 3.80–5.20)
RDW: 13.8 % (ref 11.5–14.5)
WBC: 11.3 10*3/uL — ABNORMAL HIGH (ref 3.6–11.0)

## 2016-05-18 LAB — URINALYSIS COMPLETE WITH MICROSCOPIC (ARMC ONLY)
Bilirubin Urine: NEGATIVE
Glucose, UA: NEGATIVE mg/dL
Nitrite: NEGATIVE
PROTEIN: 100 mg/dL — AB
Specific Gravity, Urine: 1.028 (ref 1.005–1.030)
pH: 5 (ref 5.0–8.0)

## 2016-05-18 LAB — PREGNANCY, URINE: PREG TEST UR: NEGATIVE

## 2016-05-18 NOTE — ED Triage Notes (Signed)
Pt reports increased vaginal bleeding that has been going on for the past few weeks. Pt states she is changing pads about 1-2x per day,however, today she almost passed out which is not normal for her. Pt states she has a hx of low iron. Pt also c/o headache and backaches as well. Pt recently on Lupron? Pt  Not sure of the name but it was supposed to help stop her periods.

## 2016-05-18 NOTE — ED Notes (Signed)
Pt. Verbalizes understanding of d/c instructions, and follow-up. VS stable and pt. Denies pain. Pt. In NAD at time of d/c and denies further concerns regarding this visit. Pt. Stable at the time of departure from the unit, departing unit by the safest and most appropriate manner per that pt condition and limitations. Pt advised to return to the ED at any time for emergent concerns, or for new/worsening symptoms.

## 2016-05-18 NOTE — ED Notes (Signed)
Pt. States vaginal bleeding throughout the past year, associated with lower back cramps, weakness, fatigue. Past 2 months bleeding has been more consistent and clotting. Pt. Says even more weak and faint feeling in past 2 weeks, barely able to get out of bed.

## 2016-05-18 NOTE — ED Provider Notes (Signed)
[facility] Emergency Department Provider Note        Time seen: ----------------------------------------- 4:58 PM on 05/18/2016 -----------------------------------------    I have reviewed the triage vital signs and the nursing notes.   HISTORY  Chief Complaint Near Syncope and Vaginal Bleeding    HPI Autumn Chang is a 45 y.o. female who presents to the ER forvaginal bleeding that has been going on for several weeks. Patient states she is changing pads about 1-2 per day but today she felt the bleeding was heavier, she was passing clots since he was near syncopal. In the past she had been diagnosed with low iron. She also complains of headache and backaches. Recently she was placed on birth control help stop her periods. She denies fevers, chills or recent illness. She has had sweats.   Past Medical History:  Diagnosis Date  . Migraines     There are no active problems to display for this patient.   Past Surgical History:  Procedure Laterality Date  . [place], 2004   2 c-sections   . toncillectomy    . TONSILLECTOMY      Allergies Review of patient's allergies indicates no known allergies.  Social History Social History  Substance Use Topics  . Smoking status: Never Smoker  . Smokeless tobacco: Never Used  . Alcohol use No    Review of Systems Constitutional: Negative for fever. Cardiovascular: Negative for chest pain. Respiratory: Negative for shortness of breath. Gastrointestinal: Negative for abdominal pain, vomiting and diarrhea. Genitourinary: Negative for dysuria.Positive for vaginal bleeding Musculoskeletal: Negative for back pain. Skin: Negative for rash. Neurological: Negative for headaches, positive for generalized weakness  10-point ROS otherwise negative.  ____________________________________________   PHYSICAL EXAM:  VITAL SIGNS: ED Triage Vitals  Enc Vitals Group     BP 05/18/16 1634 132/90      Pulse Rate 05/18/16 1634 75     Resp 05/18/16 1634 16     Temp 05/18/16 1634 98.3 F (36.8 C)     Temp Source 05/18/16 1634 Oral     SpO2 05/18/16 1634 100 %     Weight 05/18/16 1634 205 lb (93 kg)     Height 05/18/16 1634 5\' 7"  (1.702 m)     Head Circumference --      Peak Flow --      Pain Score 05/18/16 1642 0     Pain Loc --      Pain Edu? --      Excl. in [place]? --     Constitutional: Alert and oriented. Well appearing and in no distress. Eyes: Conjunctivae are normal. PERRL. Normal extraocular movements. ENT   Head: Normocephalic and atraumatic.   Nose: No congestion/rhinnorhea.   Mouth/Throat: Mucous membranes are moist.   Neck: No stridor. Cardiovascular: Normal rate, regular rhythm. No murmurs, rubs, or gallops. Respiratory: Normal respiratory effort without tachypnea nor retractions. Breath sounds are clear and equal bilaterally. No wheezes/rales/rhonchi. Gastrointestinal: Soft and nontender. Normal bowel sounds Musculoskeletal: Nontender with normal range of motion in all extremities. No lower extremity tenderness nor edema. Neurologic:  Normal speech and language. No gross focal neurologic deficits are appreciated.  Skin:  Skin is warm, dry and intact. No rash noted. Psychiatric: Mood and affect are normal. Speech and behavior are normal.  ____________________________________________  EKG: Interpreted by me. Sinus rhythm with a rate of 92 bpm, normal PR interval, normal QRS, normal QT normal. Normal axis.  ____________________________________________  ED COURSE:  Pertinent labs &  imaging results that were available during my care of the patient were reviewed by me and considered in my medical decision making (see chart for details). Clinical Course  Patient presents to the ER in no distress, we will assess with basic labs, orthostatics and reevaluate.  Procedures ____________________________________________   LABS (pertinent  positives/negatives)  Labs Reviewed  BASIC METABOLIC PANEL - Abnormal; Notable for the following:       Result Value   Glucose, Bld 130 (*)    All other components within normal limits  CBC - Abnormal; Notable for the following:    WBC 11.3 (*)    All other components within normal limits  URINALYSIS COMPLETEWITH MICROSCOPIC ([facility] ONLY) - Abnormal; Notable for the following:    Color, Urine YELLOW (*)    APPearance HAZY (*)    Ketones, ur TRACE (*)    Hgb urine dipstick 3+ (*)    Protein, ur 100 (*)    Leukocytes, UA TRACE (*)    Bacteria, UA RARE (*)    Squamous Epithelial / LPF 6-30 (*)    All other components within normal limits  URINE CULTURE  PREGNANCY, URINE   ____________________________________________  FINAL ASSESSMENT AND PLAN  Abnormal vaginal bleeding, near syncope  Plan: Patient with labs and imaging as dictated above. Patient is no acute distress, labs are reassuring. Urine culture has been sent and she is not symptomatic, likely contaminant from vaginal bleeding. I have discussed with her OB/GYN doctor who states to have the patient call him tomorrow. She is stable for outpatient follow-up, is not orthostatic with normal lab work.   Earleen Newport, MD   Note: This dictation was prepared with Dragon dictation. Any transcriptional errors that result from this process are unintentional    Earleen Newport, MD 05/18/16 1843

## 2016-05-20 LAB — URINE CULTURE: Special Requests: NORMAL

## 2016-08-26 ENCOUNTER — Other Ambulatory Visit: Payer: Self-pay | Admitting: Family Medicine

## 2016-08-26 DIAGNOSIS — Z1231 Encounter for screening mammogram for malignant neoplasm of breast: Secondary | ICD-10-CM

## 2016-09-29 ENCOUNTER — Ambulatory Visit
Admission: RE | Admit: 2016-09-29 | Discharge: 2016-09-29 | Disposition: A | Discharge status: Home / Self Care | Payer: BLUE CROSS/BLUE SHIELD | Source: Ambulatory Visit | Attending: Family Medicine | Admitting: Family Medicine

## 2016-09-29 DIAGNOSIS — Z1231 Encounter for screening mammogram for malignant neoplasm of breast: Secondary | ICD-10-CM | POA: Insufficient documentation

## 2016-11-20 ENCOUNTER — Encounter
Admission: RE | Admit: 2016-11-20 | Discharge: 2016-11-20 | Disposition: A | Discharge status: Home / Self Care | Payer: BLUE CROSS/BLUE SHIELD | Source: Ambulatory Visit | Attending: Obstetrics and Gynecology | Admitting: Obstetrics and Gynecology

## 2016-11-20 DIAGNOSIS — N946 Dysmenorrhea, unspecified: Secondary | ICD-10-CM | POA: Insufficient documentation

## 2016-11-20 DIAGNOSIS — D251 Intramural leiomyoma of uterus: Secondary | ICD-10-CM | POA: Insufficient documentation

## 2016-11-20 DIAGNOSIS — N920 Excessive and frequent menstruation with regular cycle: Secondary | ICD-10-CM | POA: Diagnosis not present

## 2016-11-20 DIAGNOSIS — Z01812 Encounter for preprocedural laboratory examination: Secondary | ICD-10-CM | POA: Insufficient documentation

## 2016-11-20 HISTORY — DX: Personal history of urinary calculi: Z87.442

## 2016-11-20 LAB — CBC
HCT: 37.3 % (ref 35.0–47.0)
HEMOGLOBIN: 12.5 g/dL (ref 12.0–16.0)
MCH: 28.1 pg (ref 26.0–34.0)
MCHC: 33.5 g/dL (ref 32.0–36.0)
MCV: 84 fL (ref 80.0–100.0)
Platelets: 328 10*3/uL (ref 150–440)
RBC: 4.45 MIL/uL (ref 3.80–5.20)
RDW: 13.4 % (ref 11.5–14.5)
WBC: 9.1 10*3/uL (ref 3.6–11.0)

## 2016-11-20 LAB — BASIC METABOLIC PANEL
ANION GAP: 6 (ref 5–15)
BUN: 13 mg/dL (ref 6–20)
CALCIUM: 8.9 mg/dL (ref 8.9–10.3)
CO2: 26 mmol/L (ref 22–32)
Chloride: 105 mmol/L (ref 101–111)
Creatinine, Ser: 0.65 mg/dL (ref 0.44–1.00)
Glucose, Bld: 83 mg/dL (ref 65–99)
Potassium: 3.9 mmol/L (ref 3.5–5.1)
SODIUM: 137 mmol/L (ref 135–145)

## 2016-11-20 LAB — TYPE AND SCREEN
ABO/RH(D): O POS
ANTIBODY SCREEN: NEGATIVE

## 2016-11-20 NOTE — H&P (Signed)
[name] is a 46 y.o. female here forFractional D+C and Novasure ablation .  menorrhagia  + dyspareunia. Known 4 cm fibroid in 12/2015  , repeat in research study 2 cm  + leg pain with cycle c/s x2  Inability to sample the endometrium in 04/2016 given cervical stenosis and extremely high cx  Pt has been on OCPs in past without change in flow . + migraine h/a   Past Medical History:  has a past medical history of Abnormal uterine bleeding, unspecified; History of kidney stones; History of migraine headaches; and Obesity (BMI 30.0-34.9), unspecified.  Past Surgical History:  has a past surgical history that includes Cesarean section and Tonsillectomy. Family History: family history includes Breast cancer (age of onset: 62) in her maternal aunt; Endometriosis in her maternal aunt and mother; High blood pressure (Hypertension) in her mother; Hyperlipidemia (Elevated cholesterol) in her mother; No Known Problems in her sister; Pneumonia in her maternal aunt. Social History:  reports that she has never smoked. She has never used smokeless tobacco. She reports that she does not drink alcohol or use drugs. OB/GYN History:          OB History    Gravida Para Term Preterm AB Living   2 2 2     2    SAB TAB Ectopic Molar Multiple Live Births                    Allergies: has No Known Allergies. Medications:  Current Outpatient Prescriptions:  .  SUMAtriptan (IMITREX) 100 MG tablet, Take 1/2 tab 5 days prior to menstrual cycle.  Take 1/2 tab daily until your menstrual beings, Disp: 9 tablet, Rfl: 6  Review of Systems: General:                      No fatigue or weight loss Eyes:                           No vision changes Ears:                            No hearing difficulty Respiratory:                No cough or shortness of breath Pulmonary:                  No asthma or shortness of breath Cardiovascular:           No chest pain, palpitations, dyspnea on  exertion Gastrointestinal:          No abdominal bloating, chronic diarrhea, constipations, masses, pain or hematochezia Genitourinary:             No hematuria, dysuria, abnormal vaginal discharge, pelvic pain, Menometrorrhagia Lymphatic:                   No swollen lymph nodes Musculoskeletal:         No muscle weakness Neurologic:                  No extremity weakness, syncope, seizure disorder Psychiatric:                  No history of depression, delusions or suicidal/homicidal ideation    Exam:      Vitals:   11/04/16 1400  BP: 118/58  Pulse: 77    Body mass index  is 33.95 kg/m.  WDWN white/  female in NAD   Lungs: CTA  CV : RRR without murmur   Neck:  no thyromegaly Abdomen: soft , no mass, normal active bowel sounds,  non-tender, no rebound tenderness Pelvic: tanner stage 5 ,  External genitalia: vulva /labia no lesions Urethra: no prolapse Vagina: bloody d/c  Cervix: no lesions, no cervical motion tenderness   Uterus: normal size shape and contour, non-tender Adnexa: no mass,  non-tender   Rectovaginal: not done Impression:   The primary encounter diagnosis was Menorrhagia with regular cycle. Diagnoses of Intramural leiomyoma of uterus and Dysmenorrhea were also pertinent to this visit.    Plan:     she elects for Novasure ablation + FX D+C in OR  [name] is a 46 y.o. female here for Discuss fibroids and options .pt is here for f/up for menorrhagia  + dyspareunia. Known 4 cm fibroid in 12/2015  , repeat in research study 2 cm  + leg pain with cycle c/s x2  Inability to sample the endometrium in 04/2016 given cervical stenosis and extremely high cx  Pt has been on OCPs in past without change in flow . + migraine h/a   Past Medical History:  has a past medical history of Abnormal uterine bleeding, unspecified; History of kidney stones; History of migraine headaches; and Obesity (BMI 30.0-34.9), unspecified.  Past Surgical History:  has a  past surgical history that includes Cesarean section and Tonsillectomy. Family History: family history includes Breast cancer (age of onset: 19) in her maternal aunt; Endometriosis in her maternal aunt and mother; High blood pressure (Hypertension) in her mother; Hyperlipidemia (Elevated cholesterol) in her mother; No Known Problems in her sister; Pneumonia in her maternal aunt. Social History:  reports that she has never smoked. She has never used smokeless tobacco. She reports that she does not drink alcohol or use drugs. OB/GYN History:          OB History    Gravida Para Term Preterm AB Living   2 2 2     2    SAB TAB Ectopic Molar Multiple Live Births                    Allergies: has No Known Allergies. Medications:  Current Outpatient Prescriptions:  .  SUMAtriptan (IMITREX) 100 MG tablet, Take 1/2 tab 5 days prior to menstrual cycle.  Take 1/2 tab daily until your menstrual beings, Disp: 9 tablet, Rfl: 6  Review of Systems: General:                      No fatigue or weight loss Eyes:                           No vision changes Ears:                            No hearing difficulty Respiratory:                No cough or shortness of breath Pulmonary:                  No asthma or shortness of breath Cardiovascular:           No chest pain, palpitations, dyspnea on exertion Gastrointestinal:          No abdominal bloating, chronic diarrhea, constipations, masses, pain or  hematochezia Genitourinary:             No hematuria, dysuria, abnormal vaginal discharge, pelvic pain, Menometrorrhagia Lymphatic:                   No swollen lymph nodes Musculoskeletal:         No muscle weakness Neurologic:                  No extremity weakness, syncope, seizure disorder Psychiatric:                  No history of depression, delusions or suicidal/homicidal ideation    Exam:      Vitals:   11/04/16 1400  BP: 118/58  Pulse: 77    Body mass index is 33.95  kg/m.  WDWN white/  female in NAD   Lungs: CTA  CV : RRR without murmur   Neck:  no thyromegaly Abdomen: soft , no mass, normal active bowel sounds,  non-tender, no rebound tenderness Pelvic: tanner stage 5 ,  External genitalia: vulva /labia no lesions Urethra: no prolapse Vagina: bloody d/c  Cervix: no lesions, no cervical motion tenderness   Uterus: normal size shape and contour, non-tender Adnexa: no mass,  non-tender   Rectovaginal: not done Impression:   The primary encounter diagnosis was Menorrhagia with regular cycle. Diagnoses of Intramural leiomyoma of uterus and Dysmenorrhea were also pertinent to this visit.    Plan:   Discussed options , she elects for Novasure ablation + FX D+C in OR   [name] is a 46 y.o. female here for Discuss fibroids and options .pt is here for f/up for menorrhagia  + dyspareunia. Known 4 cm fibroid in 12/2015  , repeat in research study 2 cm  + leg pain with cycle c/s x2  Inability to sample the endometrium in 04/2016 given cervical stenosis and extremely high cx  Pt has been on OCPs in past without change in flow . + migraine h/a   Past Medical History:  has a past medical history of Abnormal uterine bleeding, unspecified; History of kidney stones; History of migraine headaches; and Obesity (BMI 30.0-34.9), unspecified.  Past Surgical History:  has a past surgical history that includes Cesarean section and Tonsillectomy. Family History: family history includes Breast cancer (age of onset: 79) in her maternal aunt; Endometriosis in her maternal aunt and mother; High blood pressure (Hypertension) in her mother; Hyperlipidemia (Elevated cholesterol) in her mother; No Known Problems in her sister; Pneumonia in her maternal aunt. Social History:  reports that she has never smoked. She has never used smokeless tobacco. She reports that she does not drink alcohol or use drugs. OB/GYN History:          OB History    Gravida Para Term  Preterm AB Living   2 2 2     2    SAB TAB Ectopic Molar Multiple Live Births                    Allergies: has No Known Allergies. Medications:  Current Outpatient Prescriptions:  .  SUMAtriptan (IMITREX) 100 MG tablet, Take 1/2 tab 5 days prior to menstrual cycle.  Take 1/2 tab daily until your menstrual beings, Disp: 9 tablet, Rfl: 6  Review of Systems: General:                      No fatigue or weight loss Eyes:  No vision changes Ears:                            No hearing difficulty Respiratory:                No cough or shortness of breath Pulmonary:                  No asthma or shortness of breath Cardiovascular:           No chest pain, palpitations, dyspnea on exertion Gastrointestinal:          No abdominal bloating, chronic diarrhea, constipations, masses, pain or hematochezia Genitourinary:             No hematuria, dysuria, abnormal vaginal discharge, pelvic pain, Menometrorrhagia Lymphatic:                   No swollen lymph nodes Musculoskeletal:         No muscle weakness Neurologic:                  No extremity weakness, syncope, seizure disorder Psychiatric:                  No history of depression, delusions or suicidal/homicidal ideation    Exam:      Vitals:   11/04/16 1400  BP: 118/58  Pulse: 77    Body mass index is 33.95 kg/m.  WDWN white/  female in NAD   Lungs: CTA  CV : RRR without murmur   Neck:  no thyromegaly Abdomen: soft , no mass, normal active bowel sounds,  non-tender, no rebound tenderness Pelvic: tanner stage 5 ,  External genitalia: vulva /labia no lesions Urethra: no prolapse Vagina: bloody d/c  Cervix: no lesions, no cervical motion tenderness   Uterus: normal size shape and contour, non-tender Adnexa: no mass,  non-tender   Rectovaginal: not done Impression:   The primary encounter diagnosis was Menorrhagia with regular cycle. Diagnoses of Intramural leiomyoma of uterus and  Dysmenorrhea were also pertinent to this visit.    Plan:   Discussed options , she elects for Novasure ablation + FX D+C in OR  risks of the procedure fully discussed with the pt    Return if symptoms worsen or fail to improve, for preop.  Caroline Sauger, MD

## 2016-11-20 NOTE — Patient Instructions (Signed)
  Your procedure is scheduled on: 11/24/16 Report to Day Surgery. [facility]OND [facility]LOOR To find out your arrival time please call [phone] between 1PM - 3PM on 11/21/16  Remember: Instructions that are not followed completely may result in serious medical risk, up to and including death, or upon the discretion of your surgeon and anesthesiologist your surgery may need to be rescheduled.    _X___ 1. Do not eat food or drink liquids after midnight. No gum chewing or hard candies.     ____ 2. No Alcohol for 24 hours before or after surgery.   ____ 3. Do Not Smoke For 24 Hours Prior to Your Surgery.   ____ 4. Bring all medications with you on the day of surgery if instructed.    _X___ 5. Notify your doctor if there is any change in your medical condition     (cold, fever, infections).       Do not wear jewelry, make-up, hairpins, clips or nail polish.  Do not wear lotions, powders, or perfumes. You may wear deodorant.  Do not shave 48 hours prior to surgery. Men may shave face and neck.  Do not bring valuables to the hospital.    [facility] is not responsible for any belongings or valuables.               Contacts, dentures or bridgework may not be worn into surgery.  Leave your suitcase in the car. After surgery it may be brought to your room.  For patients admitted to the hospital, discharge time is determined by your                treatment team.   Patients discharged the day of surgery will not be allowed to drive home.      ____ Take these medicines the morning of surgery with A SIP OF WATER:    1. NONE  2.   3.   4.  5.  6.  ____ Fleet Enema (as directed)   ____ Use CHG Soap as directed  ____ Use inhalers on the day of surgery  ____ Stop metformin 2 days prior to surgery    ____ Take 1/2 of usual insulin dose the night before surgery and none on the morning of surgery.   ____ Stop Coumadin/Plavix/aspirin on   __X__ Stop Anti-inflammatories on   STOP  NAPROXEN UNTIL AFTER SURGERY   ____ Stop supplements until after surgery.    ____ Bring C-Pap to the hospital.

## 2016-11-23 MED ORDER — FAMOTIDINE 20 MG PO TABS
20.0000 mg | ORAL_TABLET | Freq: Once | ORAL | Status: AC
  Administered 2016-11-24: 20 mg via ORAL

## 2016-11-23 MED ORDER — CEFOXITIN SODIUM-DEXTROSE 2-2.2 GM-% IV SOLR (PREMIX)
2.0000 g | INTRAVENOUS | Status: AC
  Administered 2016-11-24: 2000 mg via INTRAVENOUS

## 2016-11-23 MED ORDER — SOD CITRATE-CITRIC ACID 500-334 MG/5ML PO SOLN
30.0000 mL | ORAL | Status: DC
  Filled 2016-11-23: qty 30

## 2016-11-24 ENCOUNTER — Encounter: Payer: Self-pay | Admitting: *Deleted

## 2016-11-24 ENCOUNTER — Ambulatory Visit: Payer: BLUE CROSS/BLUE SHIELD | Admitting: Certified Registered Nurse Anesthetist

## 2016-11-24 ENCOUNTER — Encounter
Admission: RE | Disposition: A | Discharge status: Home / Self Care | Payer: Self-pay | Source: Ambulatory Visit | Attending: Obstetrics and Gynecology

## 2016-11-24 ENCOUNTER — Ambulatory Visit
Admission: RE | Admit: 2016-11-24 | Discharge: 2016-11-24 | Disposition: A | Discharge status: Home / Self Care | Payer: BLUE CROSS/BLUE SHIELD | Source: Ambulatory Visit | Attending: Obstetrics and Gynecology | Admitting: Obstetrics and Gynecology

## 2016-11-24 DIAGNOSIS — N946 Dysmenorrhea, unspecified: Secondary | ICD-10-CM | POA: Insufficient documentation

## 2016-11-24 DIAGNOSIS — Z8249 Family history of ischemic heart disease and other diseases of the circulatory system: Secondary | ICD-10-CM | POA: Diagnosis not present

## 2016-11-24 DIAGNOSIS — Z803 Family history of malignant neoplasm of breast: Secondary | ICD-10-CM | POA: Diagnosis not present

## 2016-11-24 DIAGNOSIS — M4802 Spinal stenosis, cervical region: Secondary | ICD-10-CM | POA: Diagnosis not present

## 2016-11-24 DIAGNOSIS — Z6830 Body mass index (BMI) 30.0-30.9, adult: Secondary | ICD-10-CM | POA: Diagnosis not present

## 2016-11-24 DIAGNOSIS — Z79899 Other long term (current) drug therapy: Secondary | ICD-10-CM | POA: Insufficient documentation

## 2016-11-24 DIAGNOSIS — D251 Intramural leiomyoma of uterus: Secondary | ICD-10-CM | POA: Diagnosis not present

## 2016-11-24 DIAGNOSIS — E669 Obesity, unspecified: Secondary | ICD-10-CM | POA: Diagnosis not present

## 2016-11-24 DIAGNOSIS — N920 Excessive and frequent menstruation with regular cycle: Secondary | ICD-10-CM | POA: Insufficient documentation

## 2016-11-24 DIAGNOSIS — Z87442 Personal history of urinary calculi: Secondary | ICD-10-CM | POA: Insufficient documentation

## 2016-11-24 DIAGNOSIS — G43909 Migraine, unspecified, not intractable, without status migrainosus: Secondary | ICD-10-CM | POA: Diagnosis not present

## 2016-11-24 DIAGNOSIS — N941 Unspecified dyspareunia: Secondary | ICD-10-CM | POA: Insufficient documentation

## 2016-11-24 HISTORY — PX: HYSTEROSCOPY WITH NOVASURE: SHX5574

## 2016-11-24 HISTORY — PX: HYSTEROSCOPY WITH D & C: SHX1775

## 2016-11-24 LAB — ABO/RH: ABO/RH(D): O POS

## 2016-11-24 LAB — POCT PREGNANCY, URINE: PREG TEST UR: NEGATIVE

## 2016-11-24 SURGERY — HYSTEROSCOPY WITH NOVASURE
Anesthesia: General

## 2016-11-24 MED ORDER — LACTATED RINGERS IV SOLN
INTRAVENOUS | Status: DC
  Administered 2016-11-24: 10:00:00 via INTRAVENOUS

## 2016-11-24 MED ORDER — ONDANSETRON HCL 4 MG/2ML IJ SOLN
INTRAMUSCULAR | Status: DC | PRN
  Administered 2016-11-24: 4 mg via INTRAVENOUS

## 2016-11-24 MED ORDER — SILVER NITRATE-POT NITRATE 75-25 % EX MISC
CUTANEOUS | Status: AC
  Filled 2016-11-24: qty 1

## 2016-11-24 MED ORDER — PROPOFOL 10 MG/ML IV BOLUS
INTRAVENOUS | Status: DC | PRN
  Administered 2016-11-24: 200 mg via INTRAVENOUS

## 2016-11-24 MED ORDER — FENTANYL CITRATE (PF) 100 MCG/2ML IJ SOLN
INTRAMUSCULAR | Status: AC
  Filled 2016-11-24: qty 2

## 2016-11-24 MED ORDER — ONDANSETRON HCL 4 MG/2ML IJ SOLN: 4.0000 mg | Freq: Once | INTRAMUSCULAR | Status: DC | PRN

## 2016-11-24 MED ORDER — FENTANYL CITRATE (PF) 100 MCG/2ML IJ SOLN: 25.0000 ug | INTRAMUSCULAR | Status: DC | PRN

## 2016-11-24 MED ORDER — MIDAZOLAM HCL 2 MG/2ML IJ SOLN
INTRAMUSCULAR | Status: AC
  Filled 2016-11-24: qty 2

## 2016-11-24 MED ORDER — LACTATED RINGERS IV SOLN: INTRAVENOUS | Status: DC

## 2016-11-24 MED ORDER — KETOROLAC TROMETHAMINE 30 MG/ML IJ SOLN
INTRAMUSCULAR | Status: DC | PRN
  Administered 2016-11-24: 30 mg via INTRAVENOUS

## 2016-11-24 MED ORDER — LIDOCAINE HCL (PF) 2 % IJ SOLN
INTRAMUSCULAR | Status: AC
  Filled 2016-11-24: qty 2

## 2016-11-24 MED ORDER — FAMOTIDINE 20 MG PO TABS
ORAL_TABLET | ORAL | Status: AC
  Filled 2016-11-24: qty 1

## 2016-11-24 MED ORDER — LIDOCAINE HCL (CARDIAC) 20 MG/ML IV SOLN
INTRAVENOUS | Status: DC | PRN
  Administered 2016-11-24: 50 mg via INTRAVENOUS

## 2016-11-24 MED ORDER — CEFOXITIN SODIUM-DEXTROSE 2-2.2 GM-% IV SOLR (PREMIX)
INTRAVENOUS | Status: AC
  Administered 2016-11-24: 2000 mg via INTRAVENOUS
  Filled 2016-11-24: qty 50

## 2016-11-24 MED ORDER — FENTANYL CITRATE (PF) 100 MCG/2ML IJ SOLN
INTRAMUSCULAR | Status: DC | PRN
  Administered 2016-11-24: 25 ug via INTRAVENOUS
  Administered 2016-11-24 (×2): 50 ug via INTRAVENOUS
  Administered 2016-11-24: 25 ug via INTRAVENOUS

## 2016-11-24 MED ORDER — MIDAZOLAM HCL 2 MG/2ML IJ SOLN
INTRAMUSCULAR | Status: DC | PRN
  Administered 2016-11-24: 2 mg via INTRAVENOUS

## 2016-11-24 MED ORDER — ONDANSETRON HCL 4 MG/2ML IJ SOLN
INTRAMUSCULAR | Status: AC
  Filled 2016-11-24: qty 2

## 2016-11-24 MED ORDER — PROPOFOL 10 MG/ML IV BOLUS
INTRAVENOUS | Status: AC
  Filled 2016-11-24: qty 20

## 2016-11-24 MED ORDER — DEXAMETHASONE SODIUM PHOSPHATE 10 MG/ML IJ SOLN
INTRAMUSCULAR | Status: DC | PRN
  Administered 2016-11-24: 10 mg via INTRAVENOUS

## 2016-11-24 SURGICAL SUPPLY — 17 items
CANISTER SUCT 1200ML W/VALVE (MISCELLANEOUS) ×2 IMPLANT
CANISTER SUCT 3000ML (MISCELLANEOUS) ×2 IMPLANT
CATH ROBINSON RED A/P 16FR (CATHETERS) ×2 IMPLANT
GLOVE BIO SURGEON STRL SZ8 (GLOVE) ×2 IMPLANT
GOWN STRL REUS W/ TWL LRG LVL3 (GOWN DISPOSABLE) ×1 IMPLANT
GOWN STRL REUS W/ TWL XL LVL3 (GOWN DISPOSABLE) ×1 IMPLANT
GOWN STRL REUS W/TWL LRG LVL3 (GOWN DISPOSABLE) ×1
GOWN STRL REUS W/TWL XL LVL3 (GOWN DISPOSABLE) ×1
IV LACTATED RINGERS 1000ML (IV SOLUTION) ×2 IMPLANT
KIT RM TURNOVER CYSTO AR (KITS) ×2 IMPLANT
NOVASURE ENDOMETRIAL ABLATION (MISCELLANEOUS) ×2 IMPLANT
NS IRRIG 500ML POUR BTL (IV SOLUTION) ×2 IMPLANT
PACK DNC HYST (MISCELLANEOUS) ×2 IMPLANT
PAD OB MATERNITY 4.3X12.25 (PERSONAL CARE ITEMS) ×2 IMPLANT
PAD PREP 24X41 OB/GYN DISP (PERSONAL CARE ITEMS) ×2 IMPLANT
TOWEL OR 17X26 4PK STRL BLUE (TOWEL DISPOSABLE) ×2 IMPLANT
TUBING CONNECTING 10 (TUBING) ×2 IMPLANT

## 2016-11-24 NOTE — Progress Notes (Signed)
Pt here for Fractional D+C and novasure ablation . LAbs reviewed . Neg HCG. NPO . Ready to proceed

## 2016-11-24 NOTE — Brief Op Note (Signed)
11/24/2016  12:48 PM  PATIENT:  Autumn Chang  46 y.o. female  PRE-OPERATIVE DIAGNOSIS:  Menorrhagia  Dysmenorrhea  POST-OPERATIVE DIAGNOSIS: same PROCEDURE:  Procedure(s): HYSTEROSCOPY WITH NOVASURE (N/A) DILATATION AND CURETTAGE /HYSTEROSCOPY (N/A)  SURGEON:  Surgeon(s) and Role:    Boykin Nearing, MD - Primary  PHYSICIAN ASSISTANT:scrub tech Doman  ASSISTANTS: none none  ANESTHESIA:  GETA  EBL:  Total I/O In: 600 [I.V.:600] Out: - urine 100 cc EBL - minimal  BLOOD ADMINISTERED:none  DRAINS: none   LOCAL MEDICATIONS USED:  NONE  SPECIMEN:  Source of Specimen:  ecc, embx  DISPOSITION OF SPECIMEN:  PATHOLOGY  COUNTS:  YES  TOURNIQUET:  * No tourniquets in log *  DICTATION: .verbal PLAN OF CARE: d/c home   PATIENT DISPOSITION:  PACU - hemodynamically stable.

## 2016-11-24 NOTE — Anesthesia Post-op Follow-up Note (Cosign Needed)
Anesthesia QCDR form completed.        

## 2016-11-24 NOTE — Anesthesia Procedure Notes (Signed)
Procedure Name: LMA Insertion Performed by: Braylin Formby Pre-anesthesia Checklist: Patient identified, Patient being monitored, Timeout performed, Emergency Drugs available and Suction available Patient Re-evaluated:Patient Re-evaluated prior to inductionOxygen Delivery Method: Circle system utilized Preoxygenation: Pre-oxygenation with 100% oxygen Intubation Type: IV induction LMA: LMA inserted LMA Size: 3.5 Tube type: Oral Number of attempts: 1 Placement Confirmation: positive ETCO2 and breath sounds checked- equal and bilateral Tube secured with: Tape Dental Injury: Teeth and Oropharynx as per pre-operative assessment        

## 2016-11-24 NOTE — Transfer of Care (Signed)
Immediate Anesthesia Transfer of Care Note  Patient: Autumn Chang  Procedure(s) Performed: Procedure(s): HYSTEROSCOPY WITH NOVASURE (N/A) DILATATION AND CURETTAGE /HYSTEROSCOPY (N/A)  Patient Location: PACU  Anesthesia Type:General  Level of Consciousness: sedated  Airway & Oxygen Therapy: Patient Spontanous Breathing and Patient connected to face mask oxygen  Post-op Assessment: Report given to RN and Post -op Vital signs reviewed and stable  Post vital signs: Reviewed and stable  Last Vitals:  Vitals:   11/24/16 1005 11/24/16 1303  BP: 125/88 (!) 138/99  Pulse: 78 70  Resp: 20 12  Temp: 36.7 C 36.1 C    Last Pain:  Vitals:   11/24/16 1005  TempSrc: Oral         Complications: No apparent anesthesia complications

## 2016-11-24 NOTE — Discharge Instructions (Signed)

## 2016-11-24 NOTE — Anesthesia Preprocedure Evaluation (Addendum)
Anesthesia Evaluation  Patient identified by MRN, date of birth, ID band Patient awake    Reviewed: Allergy & Precautions, NPO status , Patient's Chart, lab work & pertinent test results  Airway Mallampati: III  TM Distance: <3 FB     Dental  (+) Caps   Pulmonary neg pulmonary ROS,    Pulmonary exam normal        Cardiovascular negative cardio ROS Normal cardiovascular exam     Neuro/Psych  Headaches, negative psych ROS   GI/Hepatic negative GI ROS, Neg liver ROS,   Endo/Other  negative endocrine ROS  Renal/GU stones  Female GU complaint     Musculoskeletal negative musculoskeletal ROS (+)   Abdominal Normal abdominal exam  (+)   Peds negative pediatric ROS (+)  Hematology negative hematology ROS (+)   Anesthesia Other Findings   Reproductive/Obstetrics                            Anesthesia Physical Anesthesia Plan  ASA: II  Anesthesia Plan: General   Post-op Pain Management:    Induction: Intravenous  Airway Management Planned: LMA  Additional Equipment:   Intra-op Plan:   Post-operative Plan: Extubation in OR  Informed Consent: I have reviewed the patients History and Physical, chart, labs and discussed the procedure including the risks, benefits and alternatives for the proposed anesthesia with the patient or authorized representative who has indicated his/her understanding and acceptance.     Plan Discussed with: CRNA and Surgeon  Anesthesia Plan Comments:         Anesthesia Quick Evaluation

## 2016-11-25 NOTE — Anesthesia Postprocedure Evaluation (Signed)
Anesthesia Post Note  Patient: Autumn Chang  Procedure(s) Performed: Procedure(s) (LRB): HYSTEROSCOPY WITH NOVASURE (N/A) DILATATION AND CURETTAGE /HYSTEROSCOPY (N/A)  Patient location during evaluation: PACU Anesthesia Type: General Level of consciousness: awake and alert and oriented Pain management: pain level controlled Vital Signs Assessment: post-procedure vital signs reviewed and stable Respiratory status: spontaneous breathing Cardiovascular status: blood pressure returned to baseline Anesthetic complications: no     Last Vitals:  Vitals:   11/24/16 1348 11/24/16 1449  BP: 131/86 136/86  Pulse: 65 61  Resp: 18 16  Temp: 36.7 C     Last Pain:  Vitals:   11/25/16 0833  TempSrc:   PainSc: 1                  Jayko Voorhees

## 2016-11-25 NOTE — Op Note (Signed)
NAME:  Allbaugh,                          ACCOUNT NO.:  MEDICAL RECORD NO.:  [id number]  LOCATION:                                 FACILITY:  PHYSICIAN:  Laverta Baltimore, MD     DATE OF BIRTH:  DATE OF PROCEDURE:  11/24/2016 DATE OF DISCHARGE:                              OPERATIVE REPORT   PREOPERATIVE DIAGNOSIS: 1. Menorrhagia. 2. Dysmenorrhea.  POSTOPERATIVE DIAGNOSIS: 1. Menorrhagia. 2. Dysmenorrhea.  PROCEDURE PERFORMED: 1. Fractional dilation and curettage. 2. Hysteroscopy. 3. NovaSure endometrial ablation.  SURGEON:  Laverta Baltimore, MD  ANESTHESIA:  General endotracheal anesthesia.  FIRST ASSISTANT:  Scrub tech, Doman.  INDICATIONS:  A 46 year old female with a long history of heavy menstrual flow and significant dysmenorrhea.  The patient's cervix was unable to be dilated and endometrial biopsy could not be performed in the office setting.  The patient is known to have a 4-cm fibroid.  DESCRIPTION OF PROCEDURE:  After adequate general endotracheal anesthesia, the patient was placed in a dorsal supine position, legs in the candy-cane stirrups.  Lower abdomen, perineum, and vagina were prepped and draped in normal sterile fashion.  Time-out was performed. The patient did receive 2 g IV cefoxitin prior to commencement of the case.  Red Robinson catheter was used to drain the patient's bladder, which yielded 100 mL clear urine.  A weighted speculum was placed in the posterior vaginal vault.  Anterior cervix was grasped with a single- tooth tenaculum.  Endocervical curettage was performed followed by dilation with #14 Hanks dilator.  Cervix was high in the vault and tilted.  Once the cervix was partially dilated, endometrial sound was performed and the uterus measured 10 cm.  Cervix was dilated additionally to 17 Hanks dilator.  The hysteroscope was then advanced into the endometrial cavity without difficulty.  Lactated Ringer was used as distending medium  and endometrial cavity showed glandular tissue.  No definitive mass or polyp noted.  Hysteroscope was removed and the endometrial curettage was performed.  The NovaSure ablator was brought up to the operative field, and based on the Seldinger length of 10 cm and the cervical length of 5 cm, the cavity length was estimated at 5 cm.  The ablator was placed without difficulty and the array was opened.  Endometrial cavity with 4.6 cm.  The cavity assessment test was performed and passed, and based on the previously mentioned measurements, the power setting was calculated at 127.  The ablation then took place for 1 minute 33 seconds without difficulty.  The ablator was removed.  Repeat hysteroscopy showed normal charring effect as anticipated.  COMPLICATIONS:  There were no complications.  ESTIMATED BLOOD LOSS:  Minimal.  URINE OUTPUT:  100 mL.  INTRAOPERATIVE FLUIDS:  600 mL.  The patient did receive Toradol at the end of the case.          ______________________________ Laverta Baltimore, MD     TS/MEDQ  D:  11/24/2016  T:  11/25/2016  Job:  [id number]

## 2016-11-26 LAB — SURGICAL PATHOLOGY

## 2017-06-12 IMAGING — MR MR LUMBAR SPINE W/O CM
4 of 5 series · 19 of 48 positions shown · non-contrast
Comparison: None.

CLINICAL DATA: Right-sided low back pain with right-sided sciatica.
Buttock and pelvic pain for 4- 6 months.

EXAM:
MRI LUMBAR SPINE WITHOUT CONTRAST
TECHNIQUE: Multiplanar, multisequence MR imaging of the lumbar spine was
performed. No intravenous contrast was administered.

[Series 3: T1 · sagittal · 4.0mm · 0.51mm/px · 3 of 15 slices shown (1 of 2)]
[im 3/15]
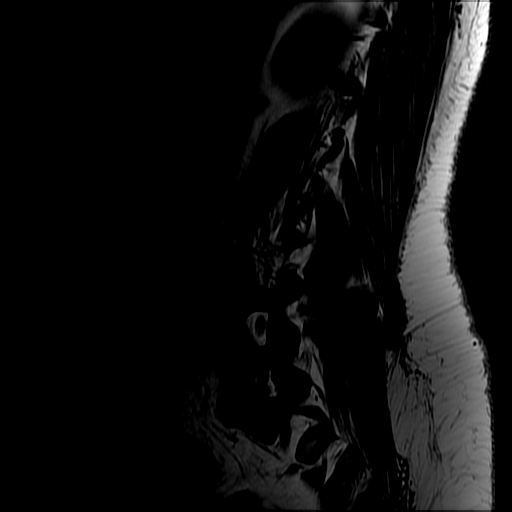
[im 9/15]
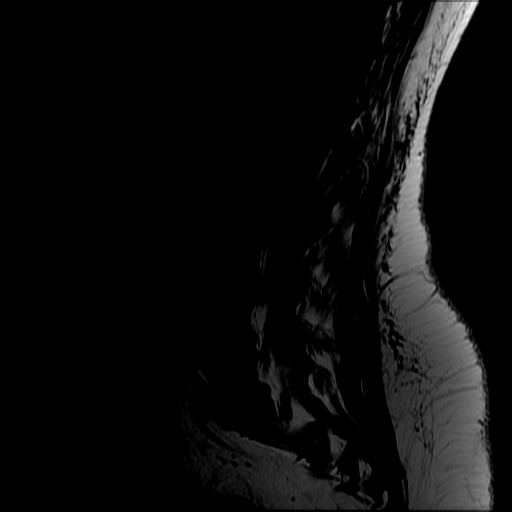
[im 15/15]
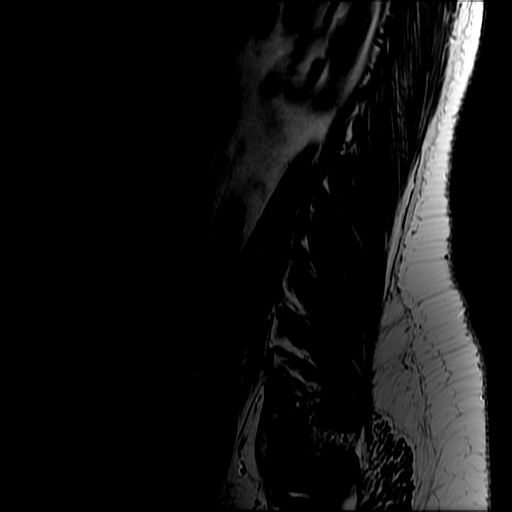

[Series 4: T2 · sagittal · 4.0mm · 0.51mm/px · 7 of 15 slices shown (1 of 2)]
[im 1/15]
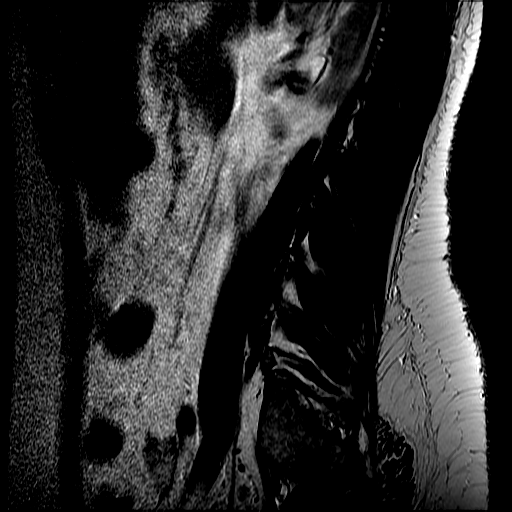
[im 3/15]
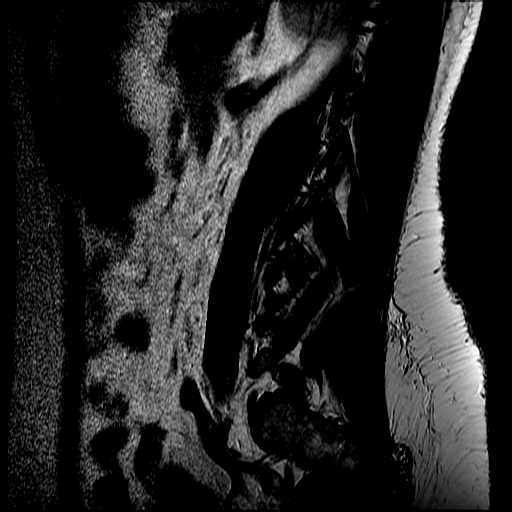
[im 5/15]
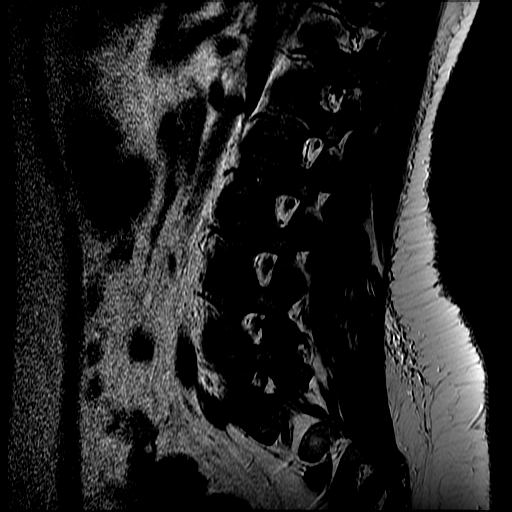
[im 8/15]
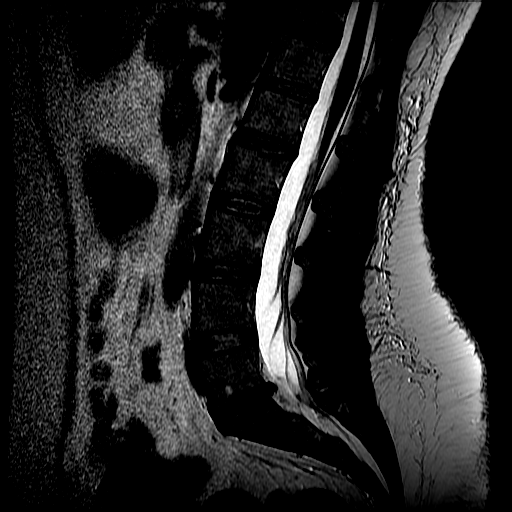
[im 10/15]
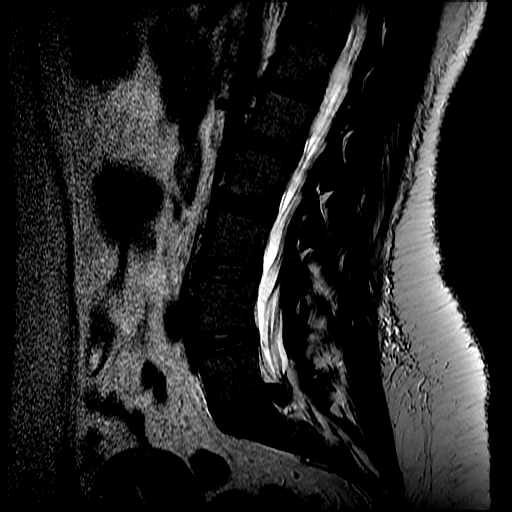
[im 12/15]
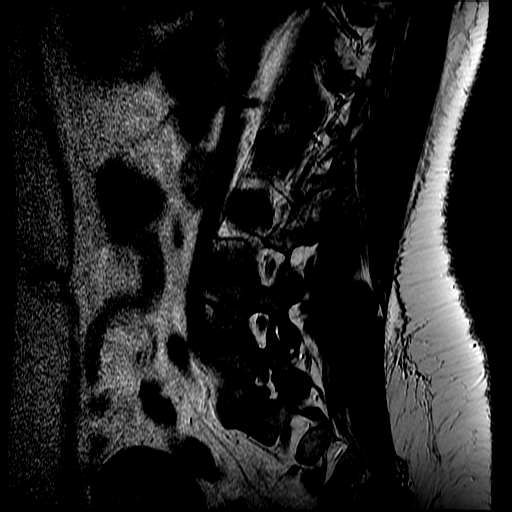
[im 15/15]
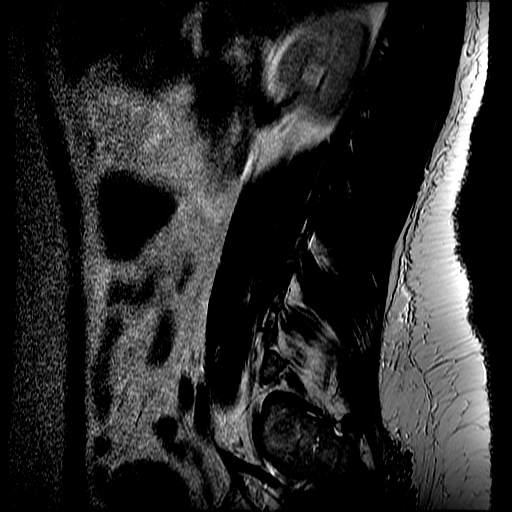

[Series 6: T2 · axial · 4.0mm · 0.39mm/px · z∈[-52,+88]mm · 6 of 31 slices shown (2 of 2)]
[im 1/31]
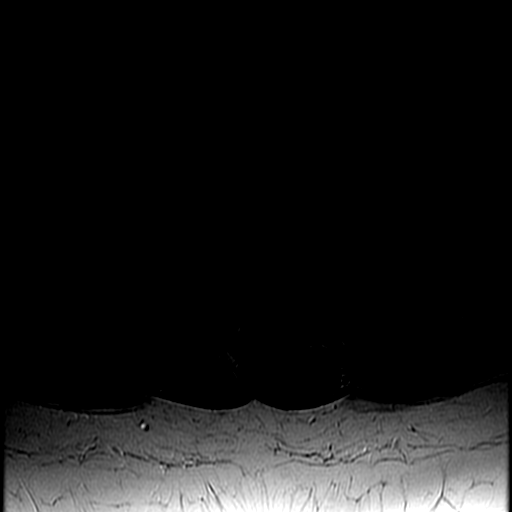
[im 5/31]
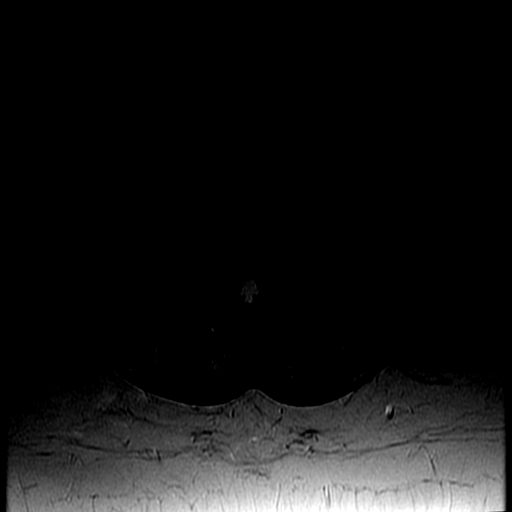
[im 10/31]
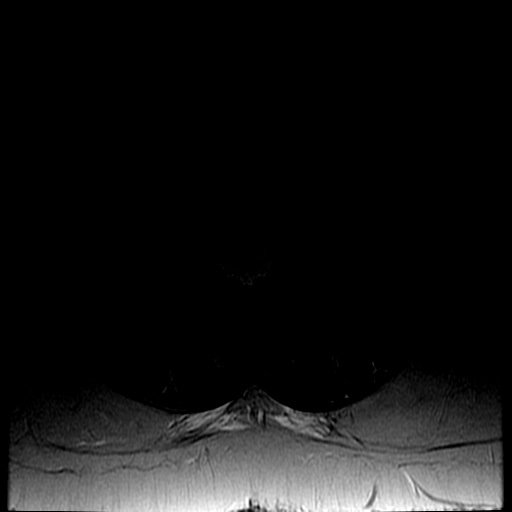
[im 14/31]
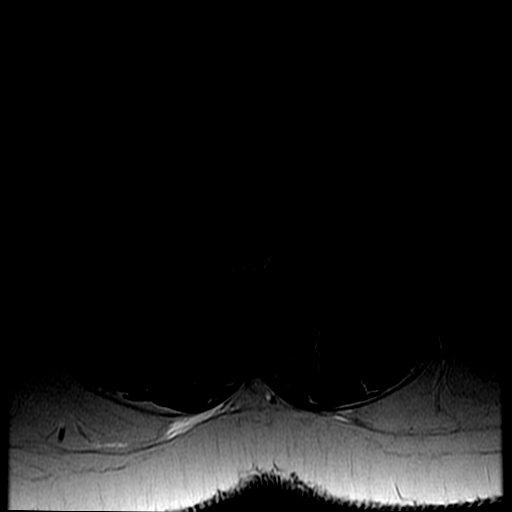
[im 17/31]
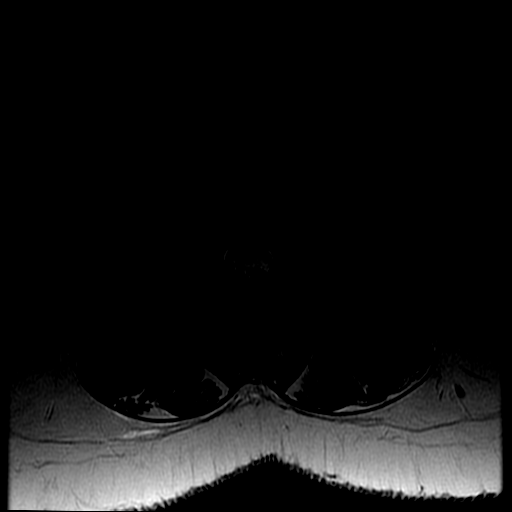
[im 26/31]
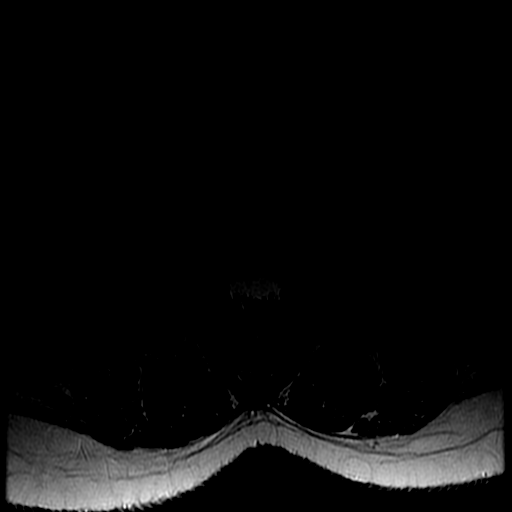

[Series 7: T1 · axial · 4.0mm · 0.39mm/px · z∈[-32,+88]mm · 3 of 31 slices shown (2 of 2)]
[im 5/31]
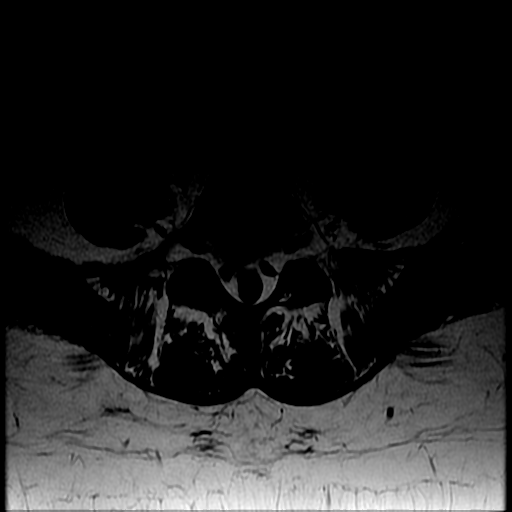
[im 17/31]
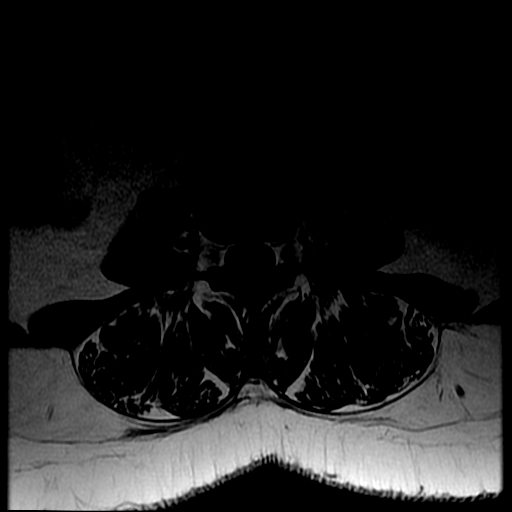
[im 26/31]
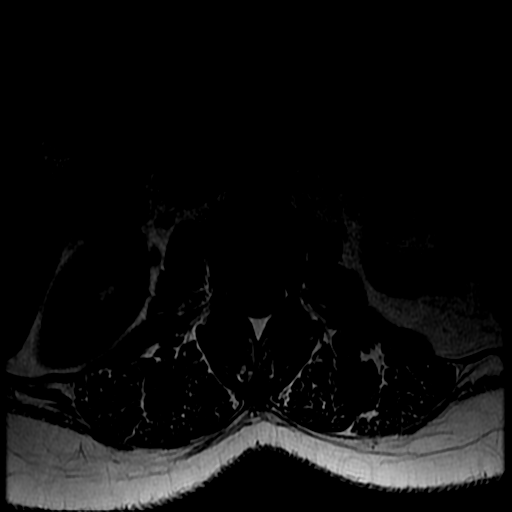

[19 of 48 positions shown; findings below may reference images not displayed]

FINDINGS: For the purposes of this dictation, the lowest well-formed
intervertebral disc space is presumed to be L5-S1.

Vertebral alignment is normal. Vertebral body heights are preserved.
Disc desiccation and moderate disc space narrowing are present at
L5-S1. Disc space heights are preserved elsewhere. No significant
vertebral marrow edema is seen. The conus medullaris is normal in
signal and terminates at L1-2. Paraspinal soft tissues are
unremarkable.

L1-2 and L2-3:  Negative.

L3-4: Slight facet arthrosis scratch that is trace facet joint
effusions. No disc herniation or stenosis.

L4-5: Mild facet arthrosis with a trace effusion on the right. No
disc herniation or stenosis.

L5-S1: Moderate-sized right paracentral disc extrusion with mild
caudal migration narrows the right lateral recess and posteriorly
displaces the right S1 nerve root. Disc space narrowing and disc
bulging asymmetric to the right result in mild right neural
foraminal stenosis. No spinal stenosis.
IMPRESSION: Moderate-sized L5-S1 disc extrusion affecting the right S1 nerve
root in the lateral recess.

## 2017-08-20 ENCOUNTER — Other Ambulatory Visit: Payer: Self-pay | Admitting: Family Medicine

## 2017-08-20 DIAGNOSIS — Z1231 Encounter for screening mammogram for malignant neoplasm of breast: Secondary | ICD-10-CM

## 2017-10-06 ENCOUNTER — Ambulatory Visit
Admission: RE | Admit: 2017-10-06 | Discharge: 2017-10-06 | Disposition: A | Discharge status: Home / Self Care | Payer: BLUE CROSS/BLUE SHIELD | Source: Ambulatory Visit | Attending: Family Medicine | Admitting: Family Medicine

## 2017-10-06 DIAGNOSIS — Z1231 Encounter for screening mammogram for malignant neoplasm of breast: Secondary | ICD-10-CM | POA: Diagnosis not present

## 2017-10-23 IMAGING — US US TRANSVAGINAL NON-OB
1 series · 13 of 25 positions shown · non-contrast
Comparison: None

CLINICAL DATA: Female pelvic pain and oligomenorrhea since [REDACTED].
LMP 11/05/2015.

EXAM:
TRANSABDOMINAL AND TRANSVAGINAL ULTRASOUND OF PELVIS
TECHNIQUE: Both transabdominal and transvaginal ultrasound examinations of the
pelvis were performed. Transabdominal technique was performed for
global imaging of the pelvis including uterus, ovaries, adnexal
regions, and pelvic cul-de-sac. It was necessary to proceed with
endovaginal exam following the transabdominal exam to visualize the
endometrium and ovaries.

[Series 1: us transvaginal non-ob · 0.22mm/px · 13 of 76 slices shown]
[im 1/76]
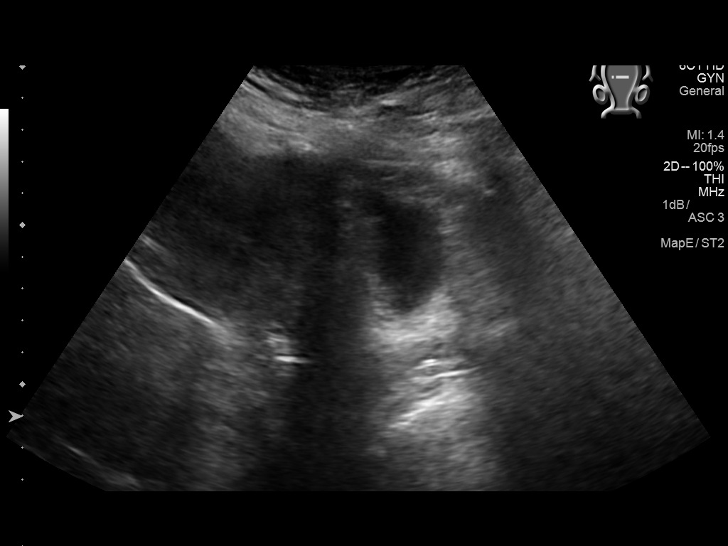
[im 7/76]
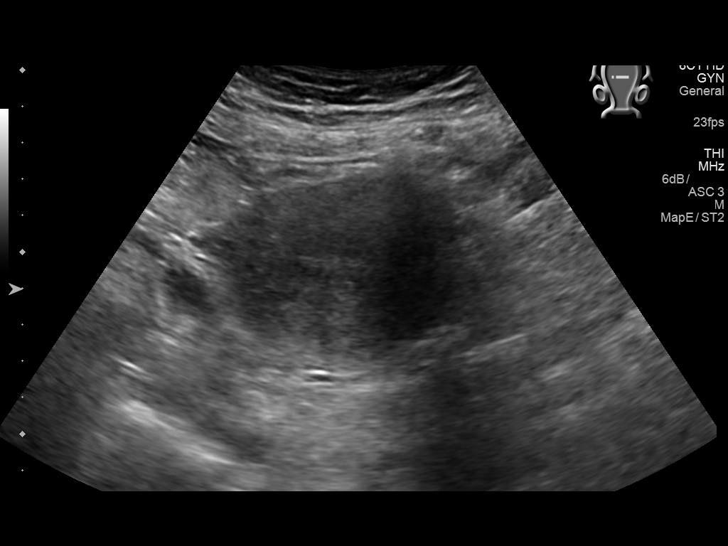
[im 13/76]
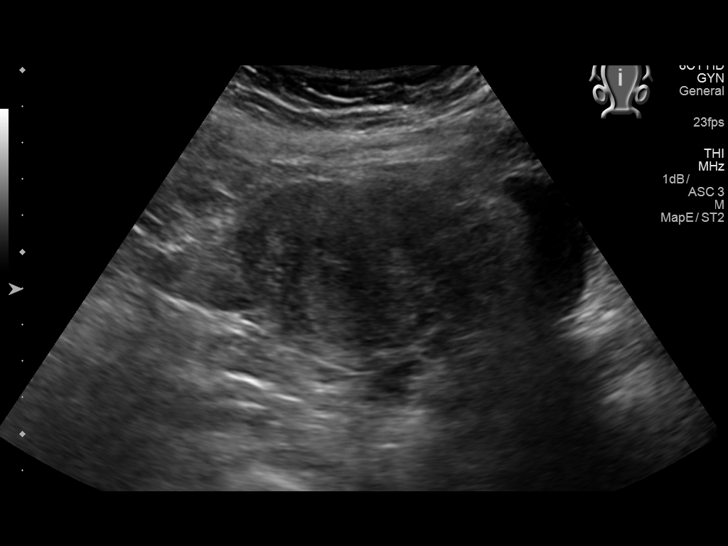
[im 19/76]
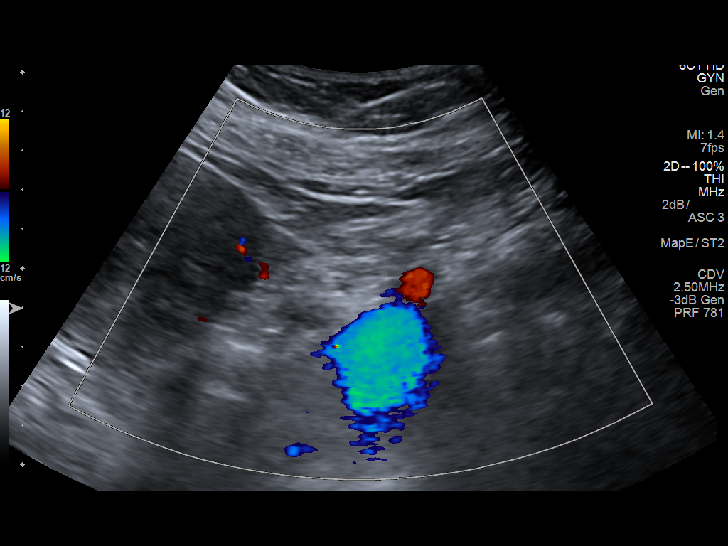
[im 26/76]
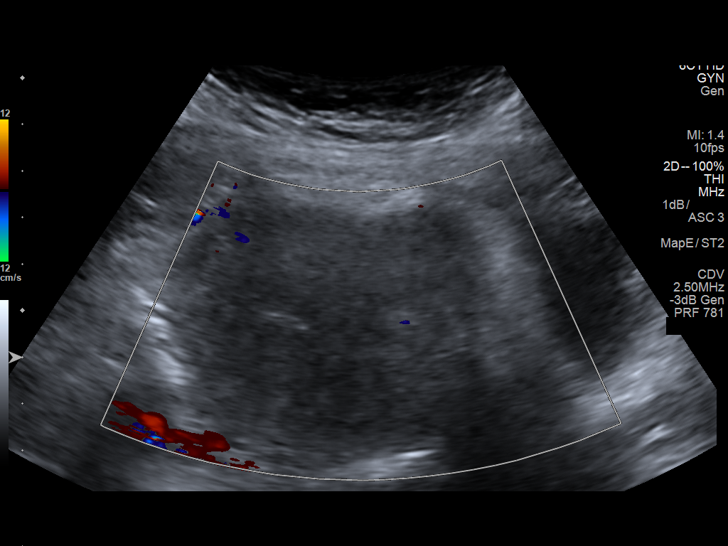
[im 32/76]
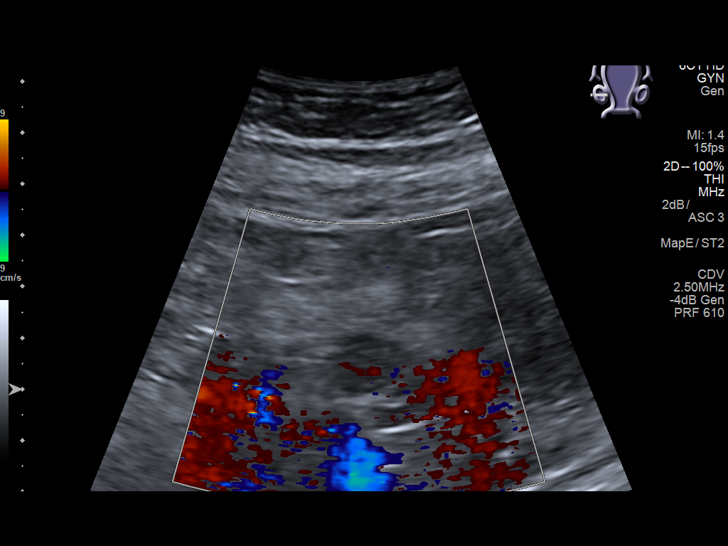
[im 38/76]
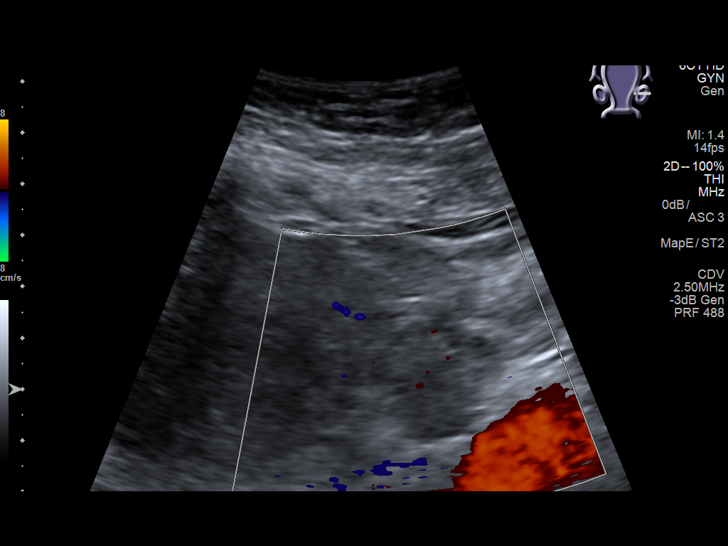
[im 44/76]
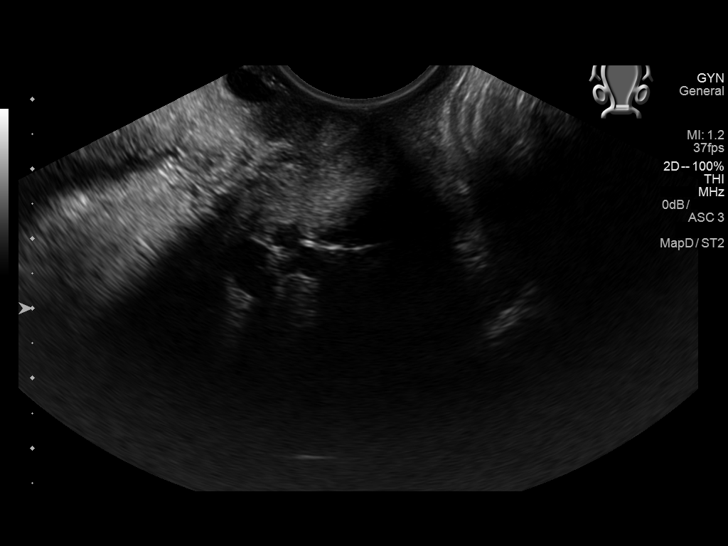
[im 51/76]
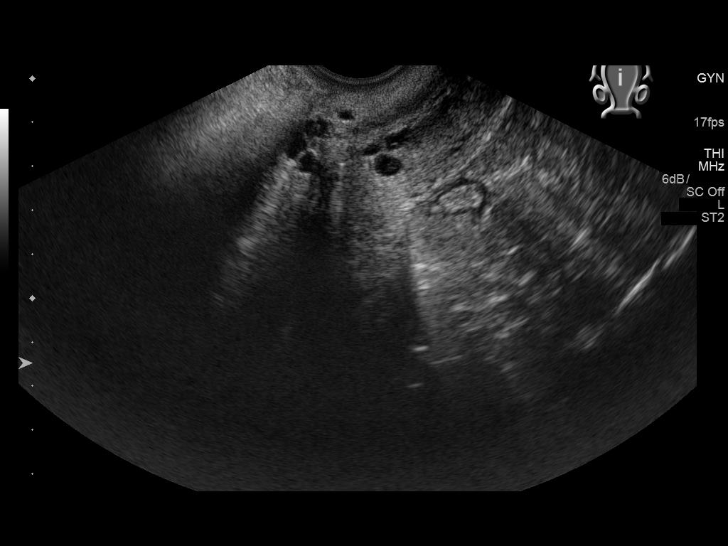
[im 57/76]
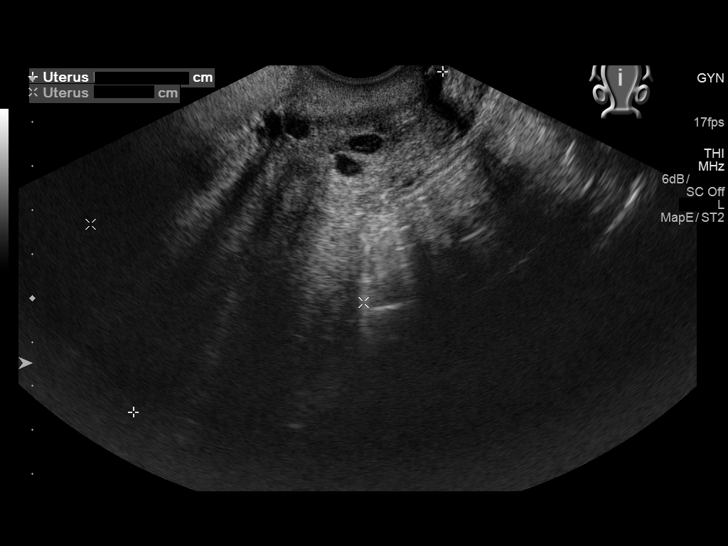
[im 63/76]
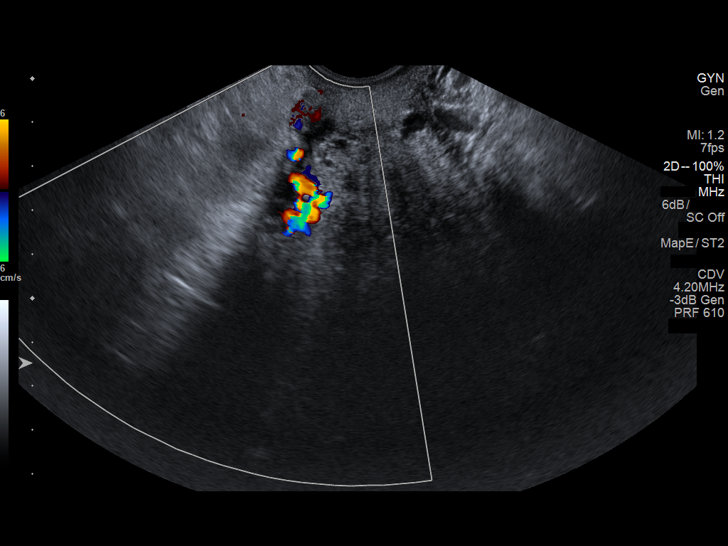
[im 69/76]
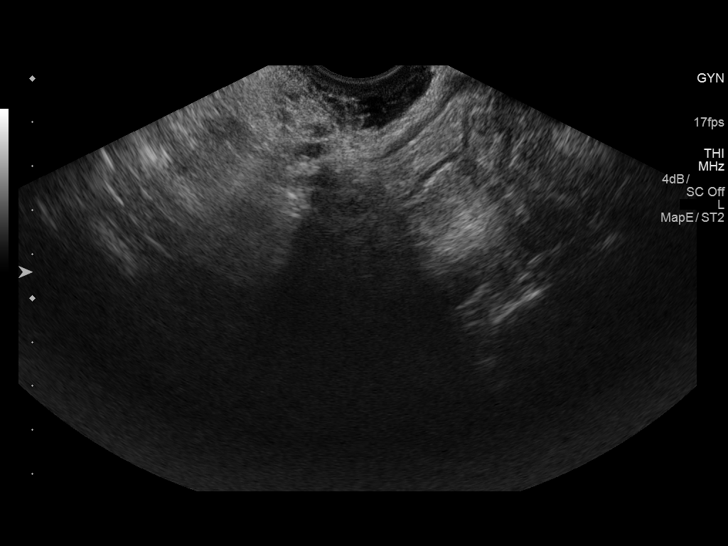
[im 76/76]
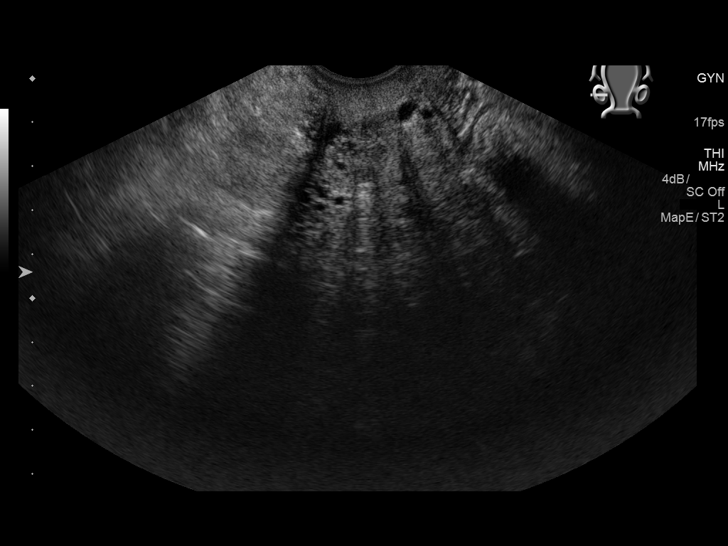

[13 of 25 positions shown; findings below may reference images not displayed]

FINDINGS: Uterus

Measurements: 11.0 x 5.1 x 7.0 cm. An intramural fibroid is seen in
the left anterior corpus measuring 4.1 x 3.4 x 2.9 cm. This is
visualized transabdominally in the uterus is not well visualized
transvaginally due to uterine lie.

Endometrium

Thickness: Suboptimally visualized both transabdominally and
transvaginally due to uterine lie. Approximately 11 mm in thickness
measured transabdominally.

Right ovary

Measurements: 2.4 x 1.6 x 2.5 cm transabdominally. Normal
transabdominal appearance/no adnexal mass.

Left ovary

Measurements: 2.6/1 0.7 x 1.8 cm transabdominally. Normal
transabdominal appearance/no adnexal mass.

Other findings

No abnormal free fluid.
IMPRESSION: Technically suboptimal exam. 4 cm left anterior uterine fibroid
noted.

Normal transabdominal appearance of both ovaries. No adnexal mass or
free fluid visualized.

## 2018-04-24 ENCOUNTER — Ambulatory Visit
Admission: EM | Admit: 2018-04-24 | Discharge: 2018-04-24 | Discharge status: Absent without leave | Payer: BLUE CROSS/BLUE SHIELD

## 2018-08-23 ENCOUNTER — Other Ambulatory Visit: Payer: Self-pay

## 2018-08-23 ENCOUNTER — Emergency Department
Admission: EM | Admit: 2018-08-23 | Discharge: 2018-08-24 | Disposition: A | Discharge status: Home / Self Care | Payer: BLUE CROSS/BLUE SHIELD | Attending: Emergency Medicine | Admitting: Emergency Medicine

## 2018-08-23 DIAGNOSIS — R61 Generalized hyperhidrosis: Secondary | ICD-10-CM | POA: Insufficient documentation

## 2018-08-23 DIAGNOSIS — Z79899 Other long term (current) drug therapy: Secondary | ICD-10-CM | POA: Insufficient documentation

## 2018-08-23 NOTE — ED Triage Notes (Signed)
Pt to the er for sweating today. Pt denies fever. Pt reports it feels like hot flashes. Pt says she is not currently having periods due to a D&C  R/t pelvic pain. No distress noted at this time.

## 2018-08-23 NOTE — ED Notes (Signed)
Pt states that yesterday she was "breaking out" and had sweat running off of her. She is not sure if it was hot flashes. She also had a cramp-like feeling in her lower back. She was concerned because she did not know the reason.

## 2018-08-24 LAB — URINALYSIS, COMPLETE (UACMP) WITH MICROSCOPIC
Bacteria, UA: NONE SEEN
Glucose, UA: NEGATIVE mg/dL
HGB URINE DIPSTICK: NEGATIVE
KETONES UR: 5 mg/dL — AB
LEUKOCYTES UA: NEGATIVE
Nitrite: NEGATIVE
PH: 5 (ref 5.0–8.0)
PROTEIN: 30 mg/dL — AB
Specific Gravity, Urine: 1.035 — ABNORMAL HIGH (ref 1.005–1.030)

## 2018-08-24 NOTE — Discharge Instructions (Signed)
Please call and schedule an appointment with primary care or gynecology.  Return to the ER for any symptom of concern if unable to schedule an appointment.

## 2018-08-24 NOTE — ED Provider Notes (Signed)
-----------------------------------------   1:23 AM on 08/24/2018 -----------------------------------------  I personally evaluated this patient along with NP Triplett.  In summary, this is a 48 year old female with a history of hot flashes.  Presents this evening for sweating earlier in the day during an argument between her daughter and spouse.  Currently no complaints.  She declined cardiac work-up including EKG as offered by the NP.  Denies chest pain or shortness of breath.  Will follow-up with her GYN for hormonal testing.  Return precautions given.  Patient verbalizes understanding and agrees with plan of care.   Paulette Blanch, MD 08/24/18 [phone]

## 2018-08-24 NOTE — ED Provider Notes (Signed)
[facility] Emergency Department Provider Note  ____________________________________________   None    (approximate)  I have reviewed the triage vital signs and the nursing notes.   HISTORY  Chief Complaint sweating   HPI Kindred Heying is a 48 y.o. female who presents to the emergency department for treatment and evaluation of an episode of excessive sweating.  Patient states that she has had hot flashes on and off for several years with short bursts of sweating, but noticed that yesterday this was more frequent and the sweating was much worse.  She has had no menstrual cycle since April of last year secondary to a D&C procedure and NovaSure endometrial ablation.  Tonight, she was doing dishes and her daughter and husband had an argument which is uncommon and that is when she began to have excessive sweating.  She has had no other symptoms including chest pain, shortness of breath, near syncope/syncope, fever, abdominal pain, nausea, or recent illness.  Past Medical History:  Diagnosis Date  . History of kidney stones   . Migraines     There are no active problems to display for this patient.   Past Surgical History:  Procedure Laterality Date  . [place], 2004   2 c-sections   . HYSTEROSCOPY W/D&C N/A 11/24/2016   Procedure: DILATATION AND CURETTAGE /HYSTEROSCOPY;  Surgeon: Boykin Nearing, MD;  Location: [facility] ORS;  Service: Gynecology;  Laterality: N/A;  . HYSTEROSCOPY WITH NOVASURE N/A 11/24/2016   Procedure: HYSTEROSCOPY WITH NOVASURE;  Surgeon: Boykin Nearing, MD;  Location: [facility] ORS;  Service: Gynecology;  Laterality: N/A;  . toncillectomy    . TONSILLECTOMY      Prior to Admission medications   Medication Sig Start Date End Date Taking? Authorizing Provider  acetaminophen (TYLENOL) 500 MG tablet Take 1,000 mg by mouth daily as needed for moderate pain or headache.    [provider]    aspirin-acetaminophen-caffeine (EXCEDRIN MIGRAINE) 203 [id number] [id number] MG tablet Take 2 tablets by mouth daily as needed for headache or migraine.    [provider]  CYANOCOBALAMIN IJ Inject 1 Dose as directed every 30 (thirty) days.    [provider]  naproxen (NAPROSYN) 500 MG tablet Take 500 mg by mouth 2 (two) times daily as needed for mild pain or moderate pain.    [provider]  SUMAtriptan (IMITREX) 100 MG tablet Take 50 mg by mouth daily as needed for migraine.  10/29/16   [provider]    Allergies Patient has no known allergies.  Family History  Problem Relation Age of Onset  . Breast cancer Maternal Aunt        [employer]  . Hypertension Mother     Social History Social History   Tobacco Use  . Smoking status: Never Smoker  . Smokeless tobacco: Never Used  Substance Use Topics  . Alcohol use: No  . Drug use: No    Review of Systems  Constitutional: No fever/chills Eyes: No visual changes. ENT: No sore throat. Cardiovascular: Denies chest pain. Respiratory: Denies shortness of breath. Gastrointestinal: No abdominal pain.  No nausea, no vomiting.  No diarrhea.  No constipation. Genitourinary: Negative for dysuria. Musculoskeletal: Positive for transverse back pain. Skin: Negative for rash. Neurological: Negative for headaches, focal weakness or numbness. ____________________________________________   PHYSICAL EXAM:  VITAL SIGNS: ED Triage Vitals [08/23/18 2133]  Enc Vitals Group     BP 122/81     Pulse Rate 97  Resp 18     Temp 98.2 F (36.8 C)     Temp Source Oral     SpO2 99 %     Weight 209 lb (94.8 kg)     Height 5\' 7"  (1.702 m)     Head Circumference      Peak Flow      Pain Score 0     Pain Loc      Pain Edu?      Excl. in [place]?     Constitutional: Alert and oriented. Well appearing and in no acute distress.  No diaphoresis Eyes: Conjunctivae are normal.  Head: Atraumatic. Nose: No  congestion/rhinnorhea. Mouth/Throat: Mucous membranes are moist.  Oropharynx non-erythematous. Neck: No stridor.   Cardiovascular: Normal rate, regular rhythm. Grossly normal heart sounds.  Good peripheral circulation. Respiratory: Normal respiratory effort.  No retractions. Lungs CTAB. Gastrointestinal: Soft and nontender. No distention. No abdominal bruits. No CVA tenderness. Musculoskeletal: No lower extremity tenderness nor edema.  No joint effusions. Neurologic:  Normal speech and language. No gross focal neurologic deficits are appreciated. No gait instability. Skin:  Skin is warm, dry and intact. No rash noted. Psychiatric: Mood and affect are normal. Speech and behavior are normal.  ____________________________________________   LABS (all labs ordered are listed, but only abnormal results are displayed)  Labs Reviewed  URINALYSIS, COMPLETE (UACMP) WITH MICROSCOPIC - Abnormal; Notable for the following components:      Result Value   Color, Urine AMBER (*)    APPearance HAZY (*)    Specific Gravity, Urine 1.035 (*)    Bilirubin Urine MODERATE (*)    Ketones, ur 5 (*)    Protein, ur 30 (*)    All other components within normal limits   ____________________________________________  EKG  Not indicated ____________________________________________  RADIOLOGY  ED MD interpretation: Not indicated  Official radiology report(s): No results found.  ____________________________________________   PROCEDURES  Procedure(s) performed: None  Procedures  Critical Care performed: No  ____________________________________________   INITIAL IMPRESSION / ASSESSMENT AND PLAN / ED COURSE  As part of my medical decision making, I reviewed the following data within the [place] chart reviewed   48 year old female presenting to the emergency department for treatment and evaluation of an episode of excessive sweating.  She also had some transverse lower  lumbar pain, but does have a history of back pain.  Urinalysis was collected, however there were a large number of squamous epithelial cells which makes the testing invalid.  She is not having any urinary symptoms at this time.  I had a conversation with the patient and offered her testing which would include lab studies, EKG, chest x-ray and any other indicated diagnostic testing.  The patient states that she really wanted hormone testing to see if she in fact is menopausal which would be causing her hot flashes and sweating episodes.  We discussed following up with primary care or her gynecologist to have these tests done as they are send out tests for [place] here in the ER.  The patient declined further lab work or other testing such as ECG and chest x-ray as she has not had any chest pain, shortness of breath, or other symptoms of concern.  She was advised that she should return to the emergency department in the event that any of these occur.  She verbalized intention to call and schedule follow-up appointment with her primary care provider.    ____________________________________________   FINAL CLINICAL IMPRESSION(S) /  ED DIAGNOSES  Final diagnoses:  Generalized hyperhidrosis     ED Discharge Orders    None       Note:  This document was prepared using Dragon voice recognition software and may include unintentional dictation errors.    Victorino Dike, FNP 08/24/18 9359    Paulette Blanch, MD 08/24/18 [phone]

## 2018-09-08 ENCOUNTER — Other Ambulatory Visit: Payer: Self-pay | Admitting: Family Medicine

## 2018-09-08 DIAGNOSIS — Z1231 Encounter for screening mammogram for malignant neoplasm of breast: Secondary | ICD-10-CM

## 2018-10-07 ENCOUNTER — Ambulatory Visit
Admission: RE | Admit: 2018-10-07 | Discharge: 2018-10-07 | Disposition: A | Discharge status: Home / Self Care | Payer: BLUE CROSS/BLUE SHIELD | Source: Ambulatory Visit | Attending: Family Medicine | Admitting: Family Medicine

## 2018-10-07 DIAGNOSIS — Z1231 Encounter for screening mammogram for malignant neoplasm of breast: Secondary | ICD-10-CM

## 2018-12-27 ENCOUNTER — Ambulatory Visit: Payer: BLUE CROSS/BLUE SHIELD | Admitting: Physical Therapy

## 2019-01-04 ENCOUNTER — Ambulatory Visit: Payer: BLUE CROSS/BLUE SHIELD | Attending: Certified Nurse Midwife | Admitting: Physical Therapy

## 2019-01-04 ENCOUNTER — Encounter: Payer: Self-pay | Admitting: Physical Therapy

## 2019-01-04 ENCOUNTER — Other Ambulatory Visit: Payer: Self-pay

## 2019-01-04 DIAGNOSIS — R2689 Other abnormalities of gait and mobility: Secondary | ICD-10-CM | POA: Diagnosis present

## 2019-01-04 DIAGNOSIS — M533 Sacrococcygeal disorders, not elsewhere classified: Secondary | ICD-10-CM | POA: Diagnosis not present

## 2019-01-04 DIAGNOSIS — M6281 Muscle weakness (generalized): Secondary | ICD-10-CM

## 2019-01-04 NOTE — Patient Instructions (Addendum)
Increase room -temp water intake ( 16 fl oz)   X 2  This week  Increased again x 3 next week = 48 floz   Decrease soda by 50%  and  Go for fruit ( banana / apple)   ___________  Complete a food diary     _________  In bed: before getting out of bed"  Ankle circles with heel down 10 reps Spread toes 10 reps  _________    Standing:  10 reps on both sides x 3 x day     3 point tap   Feet are hip width Tap forward, center under hip not feet next to each other  Tap middle\, center  Tap back    ______  Contact DOCTOR about leg cramps in both legs if this persist  ______  Make a regular routine for walking to the mailbox for better circulation

## 2019-01-05 NOTE — Therapy (Signed)
[place] MAIN [facility] SERVICES [address] [place], [place], [address] Phone: [phone]   Fax:  [phone]  Physical Therapy Evaluation  Patient Details  Name: Naira Standiford MRN: [id number] Date of Birth: 1971/01/17 Referring Provider (PT): Gilford Raid    Encounter Date: 01/04/2019  PT End of Session - 01/04/19 1202    Visit Number  1    Number of Visits  10    Date for PT Re-Evaluation  03/15/19    PT Start Time  1448    PT Stop Time  1200    PT Time Calculation (min)  57 min    Activity Tolerance  Patient tolerated treatment well;No increased pain    Behavior During Therapy  WFL for tasks assessed/performed       Past Medical History:  Diagnosis Date  . History of kidney stones   . Migraines     Past Surgical History:  Procedure Laterality Date  . [place], 2004   2 c-sections   . HYSTEROSCOPY W/D&C N/A 11/24/2016   Procedure: DILATATION AND CURETTAGE /HYSTEROSCOPY;  Surgeon: Boykin Nearing, MD;  Location: [facility] ORS;  Service: Gynecology;  Laterality: N/A;  . HYSTEROSCOPY WITH NOVASURE N/A 11/24/2016   Procedure: HYSTEROSCOPY WITH NOVASURE;  Surgeon: Boykin Nearing, MD;  Location: [facility] ORS;  Service: Gynecology;  Laterality: N/A;  . toncillectomy    . TONSILLECTOMY      There were no vitals filed for this visit.   Subjective Assessment - 01/04/19 1111    Subjective 1) pelvic pain:  Pt completed Pelvic PT in 2017and her pelvic pain (near the tailbone) improved by 50%.  Over time, pt has noticed her pain return at various level of pain. It returns at different times of the month. Pt feels she has endometriosis but has not been officially Dx by her providers. Birth control pills helped her pelvic pain by 90%. Currently pelvic pain can range from 0-4/10. The pain can sometimes make it hard to turn over in bed. Pain also occurs with bending.  This pain does not occur when during intitation / elimination of  bowels.    2) constipation: pt notices her constipation started getting worst after a fall 5years ago. Pt fell on her tailbone and had a slipped disc, Pt's sciatic pain R LE has resolved since coming to pelvic PT back in 2017.  Pt uses suppositories for bowel movements atleast 2 x week.  Without these suppositories, pt will not have a BM all week.  Pt can sense when she needs to use the bathroom.  Stool Type on Bristol Scale with suppositories Type 3, without suppositories varies between Type 1, 3.  Daily fluid intake : 16 fl oz water, 1 glass of Sprite,  1 glass of tea, no coffee, 4 oz of apple juice.  "I do not get enough fruits and vegetables".  Pt uses a step stool when on the toilet. Pt notices she need to sit on the edge of the toilet. Sometimes she feels something blocking.         Pertinent History  Gynecological Hx: 2 c-sections         [facility] PT Assessment - 01/04/19 1138      Assessment   Medical Diagnosis  constipation     Referring Provider (PT)  [place]       Precautions   Precautions  None      Restrictions   Weight Bearing Restrictions  No  Balance Screen   Has the patient fallen in the past 6 months  No      Observation/Other Assessments   Observations  ankles crossed under seat      Coordination   Gross Motor Movements are Fluid and Coordinated  --   poor propioception of feet under hips      Posture/Postural Control   Posture Comments  SLS: no trendeleberg B, but perturbation of trunk noted for B       AROM   Overall AROM   --   WFL in lumbar movements,      Strength   Overall Strength  --   hip ext B 3+/5 , hip abd 4/5 B      Palpation   SI assessment   L SIJ limited, counternutation/ hip ext end range.   L PSIS in prone more anterior.         Ambulation/Gait   Gait Comments  hip IR, supination B, narrow BOS, L toe-in                  Objective measurements completed on examination: See above findings.      [place] Adult PT  Treatment/Exercise - 01/04/19 1201      Therapeutic Activites    Therapeutic Activities  --   discussed better bowel habits w/ increased water/fiber      Neuro Re-ed    Neuro Re-ed Details   propioception of feet in HEP,         Modalities   Modalities  Moist Heat      Moist Heat Therapy   Number Minutes Moist Heat  5 Minutes    Moist Heat Location  Other (comment)      Manual Therapy   Manual therapy comments  rotational mob L, PA mob along L SIJ w/ MWM                    PT Long Term Goals - 01/04/19 1123      PT LONG TERM GOAL #1   Title  Pt will decrease her COREFO score from 16 % to <8 %     Time  10    Period  Weeks    Status  New    Target Date  03/15/19      PT LONG TERM GOAL #2   Title  Pt will decrease her use of suppositories from 2x week to < 1x week and have improved stool consistency from Type 3 to 4 across 75% of the time.     Time  10    Period  Weeks    Status  New    Target Date  03/15/19      PT LONG TERM GOAL #3   Title  Pt will report decreased pelvic pain near the tailbone at its worst 4/10 to < 2/10 with bending and when turning over in bed     Time  6    Status  New    Target Date  02/15/19      PT LONG TERM GOAL #4   Title  Pt will increase her water intake from 16 fl oz to 48 floz per day in order to stay hydrated and have less hard stools     Time  3    Period  Weeks    Target Date  01/25/19      PT LONG TERM GOAL #5   Title  Pt will demo no hypomobility on  L SIJ across 2 weeks in order to regain optimal pelvic floor lengthening for bowel movements     Time  2    Period  Weeks    Status  New    Target Date  01/19/19      Additional Long Term Goals   Additional Long Term Goals  Yes             Plan - 01/04/19 1203    Clinical Impression Statement  Pt is a 48 yo female who reports of pelvic pain and constipation. Pt's clinical presentations include limited sacroiliac mobility on L which is likely associated with L  adduction of foot and B supination, dyscoordination and strength of pelvic floor mm, weak hip weakness, low abdominal scar restrictions, poor body mechanics which places strain on the abdominal/pelvic floor mm. These are deficits that indicate an ineffective intraabdominal pressure system associated with her pelvic pain and constipation. Pt's Hx of fall onto her tailbone may have a contributing factor.   Pt was provided education on etiology of Sx with anatomy, physiology explanation with images along with the benefits of customized pelvic PT Tx based on pt's medical conditions and musculoskeletal deficits.   Following Tx today which pt tolerated without complaints, pt demo'd equal alignment of pelvic girdle, increased L SIJ mobility.Pt required excessive cues for propioception of feet placement under hips to correct narrow BOS. Plan to apply regional interdependent approach to yield longer lasting outcomes and address lower kinetic chain deficits in addition to pelvic deficits.      Personal Factors and Comorbidities  Age    Examination-Activity Limitations  Bend;Toileting    Clinical Decision Making  Moderate    Rehab Potential  Good    PT Frequency  1x / week    PT Duration  Other (comment)   10   PT Treatment/Interventions  Therapeutic activities;Functional mobility training;Stair training;Balance training;Neuromuscular re-education;Patient/family education;Manual techniques;Moist Heat;Scar mobilization;Therapeutic exercise    Consulted and Agree with Plan of Care  Patient       Patient will benefit from skilled therapeutic intervention in order to improve the following deficits and impairments:  Abnormal gait, Difficulty walking, Decreased strength, Decreased coordination, Decreased activity tolerance, Postural dysfunction, Pain, Improper body mechanics, Increased muscle spasms, Hypomobility, Decreased mobility, Decreased endurance  Visit Diagnosis: Sacrococcygeal disorders, not elsewhere  classified  Other abnormalities of gait and mobility     Problem List There are no active problems to display for this patient.   Jerl Mina  ,PT, DPT, E-RYT  01/05/2019, 8:35 AM  [place] MAIN [facility] SERVICES [address] [place], [place], [address] Phone: [phone]   Fax:  [phone]  Name: Elyshia Kumagai MRN: [id number] Date of Birth: 1971/03/10

## 2019-01-11 ENCOUNTER — Ambulatory Visit: Payer: BLUE CROSS/BLUE SHIELD | Attending: Certified Nurse Midwife | Admitting: Physical Therapy

## 2019-01-11 ENCOUNTER — Other Ambulatory Visit: Payer: Self-pay

## 2019-01-11 DIAGNOSIS — M533 Sacrococcygeal disorders, not elsewhere classified: Secondary | ICD-10-CM | POA: Diagnosis not present

## 2019-01-11 DIAGNOSIS — M4125 Other idiopathic scoliosis, thoracolumbar region: Secondary | ICD-10-CM | POA: Diagnosis not present

## 2019-01-11 DIAGNOSIS — M6281 Muscle weakness (generalized): Secondary | ICD-10-CM | POA: Diagnosis present

## 2019-01-11 DIAGNOSIS — M62838 Other muscle spasm: Secondary | ICD-10-CM | POA: Diagnosis not present

## 2019-01-11 DIAGNOSIS — R2689 Other abnormalities of gait and mobility: Secondary | ICD-10-CM | POA: Diagnosis present

## 2019-01-11 NOTE — Patient Instructions (Addendum)
gentle pressure on R and L low abdomen ( fingers flat and not deep pressure) to stretch abdominal scar massage while rocking knees side to side . Remember to breathe   Abdominal massage upward from L,  R , center to belly button 3 stroke x 3 , pressure is gentle and light with all fingers flat, not using finger tips    ___  Neck stretches at night/ morning on bed: 1) 6 directions of neck with shoulder blades down and back 5 reps each  2) Angel wings up and bat wings down 10 reps   ___ Log rolling out of bed to not strain neck and pelvic floor

## 2019-01-11 NOTE — Therapy (Signed)
[place] MAIN [facility] SERVICES [address] [place], [place], [address] Phone: [phone]   Fax:  [phone]  Physical Therapy Treatment  Patient Details  Name: Lenoria Narine MRN: [id number] Date of Birth: 08/02/1971 Referring Provider (PT): Gilford Raid    Encounter Date: 01/11/2019  PT End of Session - 01/11/19 1121    Visit Number  2    Number of Visits  10    Date for PT Re-Evaluation  03/15/19    PT Start Time  6599    PT Stop Time  1115    PT Time Calculation (min)  52 min    Activity Tolerance  Patient tolerated treatment well;No increased pain    Behavior During Therapy  WFL for tasks assessed/performed       Past Medical History:  Diagnosis Date  . History of kidney stones   . Migraines     Past Surgical History:  Procedure Laterality Date  . [place], 2004   2 c-sections   . HYSTEROSCOPY W/D&C N/A 11/24/2016   Procedure: DILATATION AND CURETTAGE /HYSTEROSCOPY;  Surgeon: Boykin Nearing, MD;  Location: [facility] ORS;  Service: Gynecology;  Laterality: N/A;  . HYSTEROSCOPY WITH NOVASURE N/A 11/24/2016   Procedure: HYSTEROSCOPY WITH NOVASURE;  Surgeon: Boykin Nearing, MD;  Location: [facility] ORS;  Service: Gynecology;  Laterality: N/A;  . toncillectomy    . TONSILLECTOMY      There were no vitals filed for this visit.  Subjective Assessment - 01/11/19 1023    Subjective  Pt reported feeling good after leaving last session. Pelvic pain resolved for atleast 2 days.  Pt started not feeling good on Saturday and Sunday 01/08/19 - 01/09/19 " fatigue, headache, and something like a hot flash," and she could not bend her body like with sitting on the toilet. Pt sneezed and she felt the pain shooting through her back.  Pt felt like staying in bed. Denied nausea, muscle aches, sore throat sneezing.  Pt took her migraine medicine which took away her headache. Today she feels better with no HA and no pain in her back.  Her  bowel movements "seem to be a little better".       Pertinent History  Gynecological Hx: 2 c-sections         [facility] PT Assessment - 01/11/19 1113      Observation/Other Assessments   Observations  slight redness along R aspect of abdominal scar ( avoided direct mobilization over scar,      Palpation   SI assessment   L SIJ restored mobility     Palpation comment  signficant tightness at upper traps/ C/T junction hypomobility                    [facility] Adult PT Treatment/Exercise - 01/11/19 1115      Neuro Re-ed    Neuro Re-ed Details   cued for scapular mobility, extension of neck ,  gentle pressure with abdominal scar massage with lower trunk rotation       Modalities   Modalities  Moist Heat      Moist Heat Therapy   Number Minutes Moist Heat  8 Minutes    Moist Heat Location  Other (comment)   abdomen, neck  -Not billed      Manual Therapy   Manual therapy comments   light fascial release adjacent to abdominal scar, not directly over scar.  Light palpation over R LQ of abdomen with precaution  to not work deep 2/2 fibroids .  STM/ MWM along neck/ shoulder, inferior mob scap spine, MWM                    PT Long Term Goals - 01/11/19 1122      PT LONG TERM GOAL #1   Title  Pt will decrease her COREFO score from 16 % to <8 %     Time  10    Period  Weeks    Status  New      PT LONG TERM GOAL #2   Title  Pt will decrease her use of suppositories from 2x week to < 1x week and have improved stool consistency from Type 3 to 4 across 75% of the time.     Time  10    Period  Weeks    Status  New      PT LONG TERM GOAL #3   Title  Pt will report decreased pelvic pain near the tailbone at its worst 4/10 to < 2/10 with bending and when turning over in bed     Time  6    Status  New      PT LONG TERM GOAL #4   Title  Pt will increase her water intake from 16 fl oz to 48 floz per day in order to stay hydrated and have less hard stools     Time  3     Period  Weeks      PT LONG TERM GOAL #5   Title  Pt will demo no hypomobility on L SIJ across 2 weeks in order to regain optimal pelvic floor lengthening for bowel movements     Time  2    Period  Weeks    Status  New      PT LONG TERM GOAL #6   Title  Pt will report decreased pelvic pain and migraines by 50% in intensity  across one month  in order to participate in activities     Time  4    Period  Weeks    Status  New    Target Date  02/08/19            Plan - 01/11/19 1135    Clinical Impression Statement  Pt showed good carry over from last session with increased L SIJ mobility and slight  improvement of bowel movements. Pt reported resolution of pelvic pain for at least 2 days. For the past few days, she experienced Sx that typical come on once a month: migraines, fatigue came on the past few days. Pt noticed sharp shooting pain from front of abdomen to back which may be likely related to scar adhesions. Pt also noticed one day she noticed the pelvic/ LBP when trying to bend over while toileting. These Sx have resolved today prior to arrival in clinic as pt took migraine medication.  These reports were followed up with further questions to screen for COVID. Pt was educated to monitor her Sx.  Today, manual Tx was applied with light pressure and fascial techniques to release abdominal scars. Light pressure was applied with caution to her fibroids.  Also addressed her forward head posture and decreased mm tightness at C/T junction. Plan to initiate postural training, posterior mm strengthening to correct forward posture for better alignment to spine and pelvis. Pt continues to benefit from skilled PT.      Personal Factors and Comorbidities  Age    Examination-Activity  Limitations  Bend;Toileting    Rehab Potential  Good    PT Frequency  1x / week    PT Duration  Other (comment)   10   PT Treatment/Interventions  Therapeutic activities;Functional mobility training;Stair  training;Balance training;Neuromuscular re-education;Patient/family education;Manual techniques;Moist Heat;Scar mobilization;Therapeutic exercise    Consulted and Agree with Plan of Care  Patient       Patient will benefit from skilled therapeutic intervention in order to improve the following deficits and impairments:  Abnormal gait, Difficulty walking, Decreased strength, Decreased coordination, Decreased activity tolerance, Postural dysfunction, Pain, Improper body mechanics, Increased muscle spasms, Hypomobility, Decreased mobility, Decreased endurance  Visit Diagnosis: Sacrococcygeal disorders, not elsewhere classified  Other abnormalities of gait and mobility  Muscle weakness (generalized)     Problem List There are no active problems to display for this patient.   Jerl Mina ,PT, DPT, E-RYT  01/11/2019, 11:41 AM  [place] MAIN [facility] SERVICES [address] [place], [place], [address] Phone: [phone]   Fax:  [phone]  Name: Lillyanna Glandon MRN: [id number] Date of Birth: 11/03/1970

## 2019-01-18 ENCOUNTER — Other Ambulatory Visit: Payer: Self-pay

## 2019-01-18 ENCOUNTER — Ambulatory Visit: Payer: BLUE CROSS/BLUE SHIELD | Admitting: Physical Therapy

## 2019-01-18 DIAGNOSIS — M533 Sacrococcygeal disorders, not elsewhere classified: Secondary | ICD-10-CM | POA: Diagnosis not present

## 2019-01-18 DIAGNOSIS — R2689 Other abnormalities of gait and mobility: Secondary | ICD-10-CM

## 2019-01-18 DIAGNOSIS — M6281 Muscle weakness (generalized): Secondary | ICD-10-CM

## 2019-01-18 NOTE — Patient Instructions (Addendum)
Improving posture:   Stretches :   Angel/ bat wing to move shoulders down, neck lengthens   10 reps  ____     band under ballmounds  while laying on back w/ knees bent  "W" exercise  10 reps x 2 sets   Band is placed under feet, knees bent, feet are hip width apart Hold band with thumbs point out, keep upper arm and elbow touching the bed the whole time  - inhale and then exhale pull bands by bending elbows hands move in a "w"  (feel shoulder blades squeezing)   ___  After you perform the pyramid massage: Pelvic tilts 10 reps

## 2019-01-18 NOTE — Therapy (Signed)
[place] MAIN [facility] SERVICES [address] [place], [place], [address] Phone: [phone]   Fax:  [phone]  Physical Therapy Treatment  Patient Details  Name: Autumn Chang MRN: [id number] Date of Birth: 04-03-1971 Referring Provider (PT): Gilford Raid    Encounter Date: 01/18/2019  PT End of Session - 01/18/19 1036    Visit Number  3    Number of Visits  10    Date for PT Re-Evaluation  03/15/19    PT Start Time  2831    PT Stop Time  1124    PT Time Calculation (min)  54 min    Activity Tolerance  Patient tolerated treatment well;No increased pain    Behavior During Therapy  WFL for tasks assessed/performed       Past Medical History:  Diagnosis Date  . History of kidney stones   . Migraines     Past Surgical History:  Procedure Laterality Date  . [place], 2004   2 c-sections   . HYSTEROSCOPY W/D&C N/A 11/24/2016   Procedure: DILATATION AND CURETTAGE /HYSTEROSCOPY;  Surgeon: Boykin Nearing, MD;  Location: [facility] ORS;  Service: Gynecology;  Laterality: N/A;  . HYSTEROSCOPY WITH NOVASURE N/A 11/24/2016   Procedure: HYSTEROSCOPY WITH NOVASURE;  Surgeon: Boykin Nearing, MD;  Location: [facility] ORS;  Service: Gynecology;  Laterality: N/A;  . toncillectomy    . TONSILLECTOMY      There were no vitals filed for this visit.  Subjective Assessment - 01/18/19 1034    Subjective  Pt reported she slept real well the night after last session. Pt continues to sleeep well. Pt 's last bowel movement this morning. It is getting easier without straining.  Pt has not had any LBP this past week.      Pertinent History  Gynecological Hx: 2 c-sections         [facility] PT Assessment - 01/18/19 1120      Palpation   Palpation comment  hypomobile C/T, T7-10, upper trap/scalenes/ levator scap tight B .  L distal end of abdominal scar tight > R        Poor pelvic tilt propioception, required excessive tactile and verbal  cues             [facility] Adult PT Treatment/Exercise - 01/18/19 1118      Neuro Re-ed    Neuro Re-ed Details   see pt instructions, cued for proper activatin of rhomboids, less upper traps in band exercise, cervical retraction shoudler depression       Moist Heat Therapy   Number Minutes Moist Heat  5 Minutes    Moist Heat Location  Cervical      Manual Therapy   Manual therapy comments  distraction at occiput, C/T, MWM, inferior mob at scap spine, Grade III mob T10 spinous process, L/R, T7-10  .  Supraabdominal fascial release with lower trunck MWM                  PT Long Term Goals - 01/18/19 1140      PT LONG TERM GOAL #1   Title  Pt will decrease her COREFO score from 16 % to <8 %     Time  10    Period  Weeks    Status  On-going      PT LONG TERM GOAL #2   Title  Pt will decrease her use of suppositories from 2x week to < 1x week and have improved  stool consistency from Type 3 to 4 across 75% of the time.     Time  10    Period  Weeks    Status  On-going      PT LONG TERM GOAL #3   Title  Pt will report decreased pelvic pain near the tailbone at its worst 4/10 to < 2/10 with bending and when turning over in bed     Time  6    Status  On-going      PT LONG TERM GOAL #4   Title  Pt will increase her water intake from 16 fl oz to 48 floz per day in order to stay hydrated and have less hard stools     Time  3    Period  Weeks    Status  On-going      PT LONG TERM GOAL #5   Title  Pt will demo no hypomobility on L SIJ across 2 weeks in order to regain optimal pelvic floor lengthening for bowel movements     Time  2    Period  Weeks    Status  On-going      PT LONG TERM GOAL #6   Title  Pt will report decreased pelvic pain and migraines by 50% in intensity  across one month  in order to participate in activities     Time  4    Period  Weeks    Status  On-going            Plan - 01/18/19 1036    Clinical Impression Statement   Pt has  not had any LBP this past week and had been sleeping better since last session. PBowel movements are easier with less straining.    Addressed pt's forward head, increased thoracic kyphosis, upper trap overuse, and low abdominal scar restrictions with manual Tx which pt tolerated.   Pt showed improved cervical retraction, scapular depression, decreased scar releases post Tx. Initiated thoracolumbar strengthening. Pt required excessive cues for pelvic tilt.    Anticipate these improvements will contribute to a better functioning intraabdominal pressure system for motility/postural staibility.  Pt continues to benefit from skilled PT.          Personal Factors and Comorbidities  Age    Examination-Activity Limitations  Bend;Toileting    Rehab Potential  Good    PT Frequency  1x / week    PT Duration  Other (comment)   10   PT Treatment/Interventions  Therapeutic activities;Functional mobility training;Stair training;Balance training;Neuromuscular re-education;Patient/family education;Manual techniques;Moist Heat;Scar mobilization;Therapeutic exercise    Consulted and Agree with Plan of Care  Patient       Patient will benefit from skilled therapeutic intervention in order to improve the following deficits and impairments:  Abnormal gait, Difficulty walking, Decreased strength, Decreased coordination, Decreased activity tolerance, Postural dysfunction, Pain, Improper body mechanics, Increased muscle spasms, Hypomobility, Decreased mobility, Decreased endurance  Visit Diagnosis: Sacrococcygeal disorders, not elsewhere classified  Other abnormalities of gait and mobility  Muscle weakness (generalized)     Problem List There are no active problems to display for this patient.   Jerl Mina ,PT, DPT, E-RYT  01/18/2019, 11:40 AM  [place] MAIN [facility] SERVICES [address] [place], [place], [address] Phone: [phone]   Fax:   [phone]  Name: Autumn Chang MRN: [id number] Date of Birth: 1970/08/13

## 2019-01-25 ENCOUNTER — Other Ambulatory Visit: Payer: Self-pay

## 2019-01-25 ENCOUNTER — Ambulatory Visit: Payer: BLUE CROSS/BLUE SHIELD | Admitting: Physical Therapy

## 2019-01-25 DIAGNOSIS — M6281 Muscle weakness (generalized): Secondary | ICD-10-CM

## 2019-01-25 DIAGNOSIS — R2689 Other abnormalities of gait and mobility: Secondary | ICD-10-CM

## 2019-01-25 DIAGNOSIS — M533 Sacrococcygeal disorders, not elsewhere classified: Secondary | ICD-10-CM | POA: Diagnosis not present

## 2019-01-25 NOTE — Patient Instructions (Addendum)
  Clam Shell 45 Degrees   Lying with hips and knees bent 45, one pillow between knees and ankles. Lift knee with exhale. Be sure pelvis does not roll backward. Do not arch back. Do 20 times, each leg, 2 times per day.     Complimentary stretch: Cross _ foot over _ thigh, opposite knee straight  10 breaths   Cross thigh over thigh, exhale to hug the thighs in with arms pulling back of thigh, shoulders/ head is relaxed down , Use towel behind thigh is needed.  10 reps  ______    Scoliosis to equal out lowered R shoulder  Wall lean Forearm slide against wall as you stand perpendicular to wall, band under feet, feet hip width apart,  Opposite elbow by side, , imagine holding pencil under armpit as you lean toward wall, lowering forearm against the wall a And slide back up , straightening body    Make sure the upper trapezius muscle does not hike up to ear as band is being pulled as you lean towards wall   10 x 2    Leaning _ only to open the _ flank area   ___  Scoliosis One sided push up with hips  10 reps x 2   ______  Scoliosis    Scoliosis "pat your back - holding towel"    Stretch R lower shoulder blade muscle , both pect muscles    1) "pat your self on the back" with L hand, holding strap , elbow up pulling strap while R hand grabs strap behind back,  Keep chin tuck, Make sure not to over arch low back    5 breaths   Or  2) Sidelying in bed , hooking mattress with R hand and elbow roll back     Pull _ arm overhead over mattress, grab the edge of mattress,pull it upward, drawing elbow away from ears  Breathing

## 2019-01-25 NOTE — Therapy (Signed)
[place] MAIN [facility] SERVICES [address] [place], [place], [address] Phone: [phone]   Fax:  [phone]  Physical Therapy Treatment  Patient Details  Name: Autumn Chang MRN: [id number] Date of Birth: [birth year] Referring Provider (PT): Gilford Raid    Encounter Date: 01/25/2019  PT End of Session - 01/25/19 1039    Visit Number  5    Number of Visits  10    Date for PT Re-Evaluation  03/15/19    PT Start Time  1025    PT Stop Time  1119    PT Time Calculation (min)  49 min    Activity Tolerance  Patient tolerated treatment well;No increased pain    Behavior During Therapy  WFL for tasks assessed/performed       Past Medical History:  Diagnosis Date  . History of kidney stones   . Migraines     Past Surgical History:  Procedure Laterality Date  . [place], 2004   2 c-sections   . HYSTEROSCOPY W/D&C N/A 11/24/2016   Procedure: DILATATION AND CURETTAGE /HYSTEROSCOPY;  Surgeon: Boykin Nearing, MD;  Location: [facility] ORS;  Service: Gynecology;  Laterality: N/A;  . HYSTEROSCOPY WITH NOVASURE N/A 11/24/2016   Procedure: HYSTEROSCOPY WITH NOVASURE;  Surgeon: Boykin Nearing, MD;  Location: [facility] ORS;  Service: Gynecology;  Laterality: N/A;  . toncillectomy    . TONSILLECTOMY      There were no vitals filed for this visit.  Subjective Assessment - 01/25/19 1032    Subjective  Pt did not have any LBP until yesterday when she was lifting heavy boxes at the [employer] which she does once a month. Pt experienced a quick lasting pain located at L lower back.  Pt used a suppository after no having a bowel movement. Pt continues to drink water    Pertinent History  Gynecological Hx: 2 c-sections         [facility] PT Assessment - 01/25/19 1101      Observation/Other Assessments   Observations  R shoulder 3 cm lower than L .  C/T junction less hypomobile, improved less upper trap overuse       Coordination   Gross Motor  Movements are Fluid and Coordinated  --   poor propioception w/ cervical spine in scoliosis exercises     Palpation   SI assessment   significant tightness at posterior ischial tuberosity R, paraspinal/ medial scapular attachments R       Ambulation/Gait   Gait Comments  improved gait with less supination, more transverse arch coactivation, longer strides  . R shoulder signficantly lower                     [facility] Adult PT Treatment/Exercise - 01/25/19 1101      Neuro Re-ed    Neuro Re-ed Details   see pt instructions, scoliosis specific alignment and technique exercises , required excessive tactile and visual cues.  scoliosis specific exericse to improve length at R flank 2/2 lowered R shoulder       Moist Heat Therapy   Number Minutes Moist Heat  5 Minutes    Moist Heat Location  --   R flank with instruction to perform clam ex     Manual Therapy   Manual therapy comments  STM/MWM along R paraspinals/ medial scap, posterior intercostals, and at R ischial tuberosity with MWM  PT Long Term Goals - 01/18/19 1140      PT LONG TERM GOAL #1   Title  Pt will decrease her COREFO score from 16 % to <8 %     Time  10    Period  Weeks    Status  On-going      PT LONG TERM GOAL #2   Title  Pt will decrease her use of suppositories from 2x week to < 1x week and have improved stool consistency from Type 3 to 4 across 75% of the time.     Time  10    Period  Weeks    Status  On-going      PT LONG TERM GOAL #3   Title  Pt will report decreased pelvic pain near the tailbone at its worst 4/10 to < 2/10 with bending and when turning over in bed     Time  6    Status  On-going      PT LONG TERM GOAL #4   Title  Pt will increase her water intake from 16 fl oz to 48 floz per day in order to stay hydrated and have less hard stools     Time  3    Period  Weeks    Status  On-going      PT LONG TERM GOAL #5   Title  Pt will demo no hypomobility on L  SIJ across 2 weeks in order to regain optimal pelvic floor lengthening for bowel movements     Time  2    Period  Weeks    Status  On-going      PT LONG TERM GOAL #6   Title  Pt will report decreased pelvic pain and migraines by 50% in intensity  across one month  in order to participate in activities     Time  4    Period  Weeks    Status  On-going            Plan - 01/25/19 1101    Clinical Impression Statement  Pt showed improved posture at C/T junction, less forward head, less overuse of upper trap, and improved pelvic tilt. 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 Abdominal scar mobility has improved. Educated pt on toileting schedule to help her improve sensation of rectal fullness and regular bowel routine as pt is unable to sense last time she eliminated and is currently using suppository. Pt was asked to document food and bowel movements and water intake on a diary chart to address this deficit.    Gait has improved significantly but required manual to lengthen R flank 2/2 lowered R shoulder. Initiated R scoliosis specific exercises to improve imbalance. Also addressed posterior pelvic floor tightness on R with manual Tx. Pt tolerated manual Tx without pain.  Downgraded scoliosis exercise to performw ithout resistance band due to pt showing poor propioception and technique. Anticipate spinal improvements will continue to improve gait, stride length, trunk/pelvic rotations.   Pt continues to benefit from skilled PT.     Personal Factors and Comorbidities  Age    Examination-Activity Limitations  Bend;Toileting    Rehab Potential  Good    PT Frequency  1x / week    PT Duration  Other (comment)   10   PT Treatment/Interventions  Therapeutic activities;Functional mobility training;Stair training;Balance training;Neuromuscular re-education;Patient/family  education;Manual techniques;Moist Heat;Scar mobilization;Therapeutic  exercise    Consulted and Agree with Plan of Care  Patient       Patient will benefit from skilled therapeutic intervention in order to improve the following deficits and impairments:  Abnormal gait, Difficulty walking, Decreased strength, Decreased coordination, Decreased activity tolerance, Postural dysfunction, Pain, Improper body mechanics, Increased muscle spasms, Hypomobility, Decreased mobility, Decreased endurance  Visit Diagnosis: 1. Other abnormalities of gait and mobility   2. Muscle weakness (generalized)   3. Sacrococcygeal disorders, not elsewhere classified        Problem List There are no active problems to display for this patient.   Jerl Mina ,PT, DPT, E-RYT  01/25/2019, 11:25 AM  [place] MAIN [facility] SERVICES [address] [place], [place], [address] Phone: [phone]   Fax:  [phone]  Name: Autumn Chang MRN: [id number] Date of Birth: 02/18/1971

## 2019-02-01 ENCOUNTER — Ambulatory Visit: Payer: BLUE CROSS/BLUE SHIELD | Admitting: Physical Therapy

## 2019-02-01 ENCOUNTER — Other Ambulatory Visit: Payer: Self-pay

## 2019-02-01 DIAGNOSIS — M6281 Muscle weakness (generalized): Secondary | ICD-10-CM

## 2019-02-01 DIAGNOSIS — M533 Sacrococcygeal disorders, not elsewhere classified: Secondary | ICD-10-CM

## 2019-02-01 DIAGNOSIS — R2689 Other abnormalities of gait and mobility: Secondary | ICD-10-CM

## 2019-02-01 NOTE — Therapy (Addendum)
[place] MAIN [facility] SERVICES [address] [place], [place], [address] Phone: [phone]   Fax:  [phone]  Physical Therapy Treatment  Patient Details  Name: Autumn Chang MRN: [id number] Date of Birth: 1971-04-02 Referring Provider (PT): Gilford Raid    Encounter Date: 02/01/2019  PT End of Session - 02/01/19 1231    Visit Number  6    Number of Visits  10    Date for PT Re-Evaluation  03/15/19    PT Start Time  3244    PT Stop Time  1125    PT Time Calculation (min)  55 min    Activity Tolerance  Patient tolerated treatment well;No increased pain    Behavior During Therapy  WFL for tasks assessed/performed       Past Medical History:  Diagnosis Date  . History of kidney stones   . Migraines     Past Surgical History:  Procedure Laterality Date  . [place], 2004   2 c-sections   . HYSTEROSCOPY W/D&C N/A 11/24/2016   Procedure: DILATATION AND CURETTAGE /HYSTEROSCOPY;  Surgeon: Boykin Nearing, MD;  Location: [facility] ORS;  Service: Gynecology;  Laterality: N/A;  . HYSTEROSCOPY WITH NOVASURE N/A 11/24/2016   Procedure: HYSTEROSCOPY WITH NOVASURE;  Surgeon: Boykin Nearing, MD;  Location: [facility] ORS;  Service: Gynecology;  Laterality: N/A;  . toncillectomy    . TONSILLECTOMY      There were no vitals filed for this visit.  Subjective Assessment - 02/01/19 1036    Subjective  Pt did not have to use suppositories this week and had 3 bowel movements ( Type 4 most of the time) .  Pt completed her food diary. Pt drank 1.5 bottles of water each this week and states she will keep on working  on this .    Pertinent History  Gynecological Hx: 2 c-sections         [facility] PT Assessment - 02/01/19 1045      Observation/Other Assessments   Observations  no capular dyskinesis despite lowered R shoulder. L quad stretch standing limited by cramping and unable to bring hip into 0 deg ext        Palpation   Spinal mobility   no mm imbalance along spine     Palpation comment  tenderness at L sacral border level of S2-3, limited sacral mobility into nutation ( improved post Tx)                    [facility] Adult PT Treatment/Exercise - 02/01/19 1228      Therapeutic Activites    Therapeutic Activities  --   discussed food diary ( general strategies fiber/water intake     Neuro Re-ed    Neuro Re-ed Details   cued for quad stretch without lumbar extension in standing see pt instructions        Moist Heat Therapy   Number Minutes Moist Heat  5 Minutes    Moist Heat Location  --   sacrum ( not billed)      Manual Therapy   Manual therapy comments  PA mob Grade III along L sacral border , promoting nutation of sacrum,                    PT Long Term Goals - 01/18/19 1140      PT LONG TERM GOAL #1   Title  Pt will decrease her COREFO score from 16 % to <  8 %     Time  10    Period  Weeks    Status  On-going      PT LONG TERM GOAL #2   Title  Pt will decrease her use of suppositories from 2x week to < 1x week and have improved stool consistency from Type 3 to 4 across 75% of the time.     Time  10    Period  Weeks    Status  On-going      PT LONG TERM GOAL #3   Title  Pt will report decreased pelvic pain near the tailbone at its worst 4/10 to < 2/10 with bending and when turning over in bed     Time  6    Status  On-going      PT LONG TERM GOAL #4   Title  Pt will increase her water intake from 16 fl oz to 48 floz per day in order to stay hydrated and have less hard stools     Time  3    Period  Weeks    Status  On-going      PT LONG TERM GOAL #5   Title  Pt will demo no hypomobility on L SIJ across 2 weeks in order to regain optimal pelvic floor lengthening for bowel movements     Time  2    Period  Weeks    Status  On-going      PT LONG TERM GOAL #6   Title  Pt will report decreased pelvic pain and migraines by 50% in intensity  across one month  in order to participate  in activities     Time  4    Period  Weeks    Status  On-going            Plan - 02/01/19 1204    Clinical Impression Statement Pt is progressing well with report of improved bowel movements ( 3 across the past week) without use of suppository and with compliance to increasing her water intake. Pt also continues to have no LBP/ sciatica for the past month with minor twinges when lifting heavy objects.   Pt showed improved mobility of her spine, pelvis , and hips today. Pt required manual Tx to increase sacral nutation on L which pt tolerated without pain. Added quad stretch into HEP which will continue to improve her lumbopelvic rhythm for better pelvic mobility for GI function/ bowel elimination and postural stability. Plan to continue addressing scoliosis because L hip extension/ L sacral mobility may be related to R spinal deviations ( R shoulder lowered) and progress pt to post walking stretches to minimize mm tensions.   Pt 's PCP will be contacted as pt would benefit from a colonoscopy given pt's chronic constipation Hx and age and she has not seen a GI specialist yet. Discussed general strategies to increase fiber and water intake. Pt to complete a second food diary and may benefit from a referral to a nutritionist as well for optimal maintenance of optimal GI function and bowel function.  Pt continues to benefit from skilled PT.     Personal Factors and Comorbidities  Age    Examination-Activity Limitations  Bend;Toileting    Rehab Potential  Good    PT Frequency  1x / week    PT Duration  Other (comment)   10   PT Treatment/Interventions  Therapeutic activities;Functional mobility training;Stair training;Balance training;Neuromuscular re-education;Patient/family education;Manual techniques;Moist Heat;Scar mobilization;Therapeutic exercise  Consulted and Agree with Plan of Care  Patient       Patient will benefit from skilled therapeutic intervention in order to improve the  following deficits and impairments:  Abnormal gait, Difficulty walking, Decreased strength, Decreased coordination, Decreased activity tolerance, Postural dysfunction, Pain, Improper body mechanics, Increased muscle spasms, Hypomobility, Decreased mobility, Decreased endurance  Visit Diagnosis: 1. Other abnormalities of gait and mobility   2. Muscle weakness (generalized)   3. Sacrococcygeal disorders, not elsewhere classified        Problem List There are no active problems to display for this patient.   Jerl Mina ,PT, DPT, E-RYT  02/01/2019, 12:45 PM  [place] MAIN [facility] SERVICES [address] [place], [place], [address] Phone: [phone]   Fax:  [phone]  Name: Autumn Chang MRN: [id number] Date of Birth: [birth year]

## 2019-02-01 NOTE — Patient Instructions (Addendum)
Do not skip breakfast   room temp water ( glass) first thing before anything else   Piece of fruit    Or oatmeal  __  Have snack options when you go to buy groceries to help you be ready when you are tired  Celery or carrot sticks  Paired with peanut butter, humus   Granola bars   Bananas./ apple with peanut butter   ___  Keep up with the water intake to increase from 1.5 bottle to 2 bottles per day this week   Lemon juice, mint, pieces fruit ( watermelon, apple, grape)   ___    Add to stretches for pelvis  childs poses rocking Toes tucked, shoulders downa nd back, paws grip , hands shoulder width apart  10 reps    Quad stretch Grabbing ankle with towel, knee back not the arching your back  10 reps x 3 L  10 reps x 2 on R  __  Add to strengthening for posture . Less forward head  Wall squat with elbows straight , pinky side of fist pressed against wall, chin tucked with back of the head pressed , shoulders down  10 reps  Lower hips to the point where knees stay above ankles to prevent knee pain   __ Referral to GI doctor:  Vonda Antigua, MD Specialties and/or Subspecialties . Gastroenterology,  . Internal Medicine,  . Hepatology . [phone] . [place] . [address] . [address] . [place], [place] [address] . [phone]

## 2019-02-08 ENCOUNTER — Other Ambulatory Visit: Payer: Self-pay

## 2019-02-08 ENCOUNTER — Ambulatory Visit: Payer: BLUE CROSS/BLUE SHIELD | Admitting: Physical Therapy

## 2019-02-08 DIAGNOSIS — R2689 Other abnormalities of gait and mobility: Secondary | ICD-10-CM

## 2019-02-08 DIAGNOSIS — M6281 Muscle weakness (generalized): Secondary | ICD-10-CM

## 2019-02-08 DIAGNOSIS — M533 Sacrococcygeal disorders, not elsewhere classified: Secondary | ICD-10-CM | POA: Diagnosis not present

## 2019-02-08 NOTE — Therapy (Signed)
[place] MAIN [facility] SERVICES [address] [place], [place], [address] Phone: [phone]   Fax:  [phone]  Physical Therapy Treatment  Patient Details  Name: Autumn Chang MRN: [id number] Date of Birth: July 10, 1971 Referring Provider (PT): Gilford Raid    Encounter Date: 02/08/2019  PT End of Session - 02/08/19 1110    Visit Number  7    Number of Visits  10    Date for PT Re-Evaluation  03/15/19    PT Start Time  0086    PT Stop Time  1125    PT Time Calculation (min)  60 min    Activity Tolerance  Patient tolerated treatment well;No increased pain    Behavior During Therapy  WFL for tasks assessed/performed       Past Medical History:  Diagnosis Date  . History of kidney stones   . Migraines     Past Surgical History:  Procedure Laterality Date  . [place], 2004   2 c-sections   . HYSTEROSCOPY W/D&C N/A 11/24/2016   Procedure: DILATATION AND CURETTAGE /HYSTEROSCOPY;  Surgeon: Boykin Nearing, MD;  Location: [facility] ORS;  Service: Gynecology;  Laterality: N/A;  . HYSTEROSCOPY WITH NOVASURE N/A 11/24/2016   Procedure: HYSTEROSCOPY WITH NOVASURE;  Surgeon: Boykin Nearing, MD;  Location: [facility] ORS;  Service: Gynecology;  Laterality: N/A;  . toncillectomy    . TONSILLECTOMY      There were no vitals filed for this visit.  Subjective Assessment - 02/08/19 1036    Subjective  Pt completed her food diary. Pt ate veggiesin one meal  across 6 out of 7 days. One small bowel movement after last week's session on Tuesday and then on Saturday, one normal sized bowel movement followed by lots of  loose stools after eating speghetti and salsa. Pt reports shoulder and back have not bothered her. She did feel shooting pain across her back when she sneezed.    Pertinent History  Gynecological Hx: 2 c-sections         [facility] PT Assessment - 02/08/19 1120      Observation/Other Assessments   Observations  pelvic tilt  without cues. improved diaphragmatic /pelvic coordination       Palpation   Palpation comment  scar restrictions distal and medial aspect of suprapubic scar       Bed Mobility   Bed Mobility  --   required proper log rolling to minimize straining pelvic                   [facility] Adult PT Treatment/Exercise - 02/08/19 1120      Therapeutic Activites    Therapeutic Activities  --   discussed increasing fiber and healthy eating choices      Neuro Re-ed    Neuro Re-ed Details   cued for deep core level 2, scapular depression with open book ex to not over use uppertrap, training for coactivaiton of deep core with sneezing to minimize pulling of back        Moist Heat Therapy   Moist Heat Location  --   abdomen during neuromuscular retraining      Manual Therapy   Manual therapy comments  scar releases with lower trunk rotation sidelying and also with upper trunk rotation ( fascial gliding)                   PT Long Term Goals - 01/18/19 1140      PT LONG  TERM GOAL #1   Title  Pt will decrease her COREFO score from 16 % to <8 %     Time  10    Period  Weeks    Status  On-going      PT LONG TERM GOAL #2   Title  Pt will decrease her use of suppositories from 2x week to < 1x week and have improved stool consistency from Type 3 to 4 across 75% of the time.     Time  10    Period  Weeks    Status  On-going      PT LONG TERM GOAL #3   Title  Pt will report decreased pelvic pain near the tailbone at its worst 4/10 to < 2/10 with bending and when turning over in bed     Time  6    Status  On-going      PT LONG TERM GOAL #4   Title  Pt will increase her water intake from 16 fl oz to 48 floz per day in order to stay hydrated and have less hard stools     Time  3    Period  Weeks    Status  On-going      PT LONG TERM GOAL #5   Title  Pt will demo no hypomobility on L SIJ across 2 weeks in order to regain optimal pelvic floor lengthening for bowel movements      Time  2    Period  Weeks    Status  On-going      PT LONG TERM GOAL #6   Title  Pt will report decreased pelvic pain and migraines by 50% in intensity  across one month  in order to participate in activities     Time  4    Period  Weeks    Status  On-going            Plan - 02/08/19 1126    Clinical Impression Statement  Pt is progressing very well towards goals with continued resolved back pain except for pulling when sneezing. Focused on scar mobilizations with fascial gliding techniques with lower trunk and upper trunk rotations. Pt demo'd decreased scar restrictions and post Tx and was education on co-activation of deep core strengthening and how to apply it with sneezing to minimize straining back. Continued to educate pt to increase fiber and healthier food choices with general recommendations and deferred pt to seek a nutritionist if need more specific information. Pt continues to benefit from skilled PT.     Personal Factors and Comorbidities  Age    Examination-Activity Limitations  Bend;Toileting    Rehab Potential  Good    PT Frequency  1x / week    PT Duration  Other (comment)   10   PT Treatment/Interventions  Therapeutic activities;Functional mobility training;Stair training;Balance training;Neuromuscular re-education;Patient/family education;Manual techniques;Moist Heat;Scar mobilization;Therapeutic exercise    Consulted and Agree with Plan of Care  Patient       Patient will benefit from skilled therapeutic intervention in order to improve the following deficits and impairments:  Abnormal gait, Difficulty walking, Decreased strength, Decreased coordination, Decreased activity tolerance, Postural dysfunction, Pain, Improper body mechanics, Increased muscle spasms, Hypomobility, Decreased mobility, Decreased endurance  Visit Diagnosis: 1. Other abnormalities of gait and mobility   2. Muscle weakness (generalized)   3. Sacrococcygeal disorders, not elsewhere  classified        Problem List There are no active problems to display for this  patient.   Jerl Mina ,PT, DPT, E-RYT  02/08/2019, 11:27 AM  [place] MAIN [facility] SERVICES [address] [place], [place], [address] Phone: [phone]   Fax:  [phone]  Name: Lorene Klimas MRN: [id number] Date of Birth: 03/27/1971

## 2019-02-08 NOTE — Patient Instructions (Addendum)
Good job on including more fiber  Continue to include a breakfast  ( if unable to, atleast a piece of fruit)  Continue with more fiber and  Shop and get more fresh veg/ fruits    ___  Open book ( handout) for decreasing midback tensions, optimizing diaphragm/pelvic floor, and stretch abdominal scar  Remember to let your midback relax onto pillow, elbow down to floor and shoulders down  15 reps    Deep core level 1 and 2 ( handout)

## 2019-02-15 ENCOUNTER — Ambulatory Visit: Payer: BLUE CROSS/BLUE SHIELD | Attending: Certified Nurse Midwife | Admitting: Physical Therapy

## 2019-02-15 ENCOUNTER — Telehealth: Payer: Self-pay | Admitting: Physical Therapy

## 2019-02-15 ENCOUNTER — Other Ambulatory Visit: Payer: Self-pay

## 2019-02-15 DIAGNOSIS — M6281 Muscle weakness (generalized): Secondary | ICD-10-CM | POA: Diagnosis present

## 2019-02-15 DIAGNOSIS — M533 Sacrococcygeal disorders, not elsewhere classified: Secondary | ICD-10-CM | POA: Diagnosis present

## 2019-02-15 DIAGNOSIS — R2689 Other abnormalities of gait and mobility: Secondary | ICD-10-CM | POA: Diagnosis not present

## 2019-02-15 NOTE — Telephone Encounter (Signed)
DPT left message with staff at pt's PCP Dr. Netty Starring re: pt's improvements with better bowel movements/ less constipation and would benefit from a referral to [facility] to see Dr. Bonna Gains and get a baseline colonoscopy.

## 2019-02-15 NOTE — Therapy (Signed)
[place] MAIN [facility] SERVICES [address] [place], [place], [address] Phone: [phone]   Fax:  [phone]  Physical Therapy Treatment  Patient Details  Name: Autumn Chang MRN: [id number] Date of Birth: 1970-08-27 Referring Provider (PT): Gilford Raid    Encounter Date: 02/15/2019  PT End of Session - 02/15/19 1126    Visit Number  8    Number of Visits  10    Date for PT Re-Evaluation  03/15/19    PT Start Time  1110    PT Stop Time  1200    PT Time Calculation (min)  50 min    Activity Tolerance  Patient tolerated treatment well;No increased pain    Behavior During Therapy  WFL for tasks assessed/performed       Past Medical History:  Diagnosis Date  . History of kidney stones   . Migraines     Past Surgical History:  Procedure Laterality Date  . [place], 2004   2 c-sections   . HYSTEROSCOPY W/D&C N/A 11/24/2016   Procedure: DILATATION AND CURETTAGE /HYSTEROSCOPY;  Surgeon: Boykin Nearing, MD;  Location: [facility] ORS;  Service: Gynecology;  Laterality: N/A;  . HYSTEROSCOPY WITH NOVASURE N/A 11/24/2016   Procedure: HYSTEROSCOPY WITH NOVASURE;  Surgeon: Boykin Nearing, MD;  Location: [facility] ORS;  Service: Gynecology;  Laterality: N/A;  . toncillectomy    . TONSILLECTOMY      There were no vitals filed for this visit.  Subjective Assessment - 02/15/19 1114    Subjective  This past week, pt had one bowel movement and then had to use the supository. The bowels appeared dried and old. Her back felt like she was able to bend butit was not painful so she decided to use the supposity. Pt added vegetables this week but not fruits. Pt feels her back and neck pain pain has improved by 80%    Pertinent History  Gynecological Hx: 2 c-sections         [facility] PT Assessment - 02/15/19 1221      Palpation   Palpation comment  scar restrictions L suprapubic scar                    [facility] Adult PT  Treatment/Exercise - 02/15/19 1219      Therapeutic Activites    Therapeutic Activities  --   strategies water intake compliance,motivational interviewing     Neuro Re-ed    Neuro Re-ed Details   body scan guided relaxation,      Manual Therapy   Manual therapy comments  scar releases, gentle fascial glides over L lower abdomen                   PT Long Term Goals - 02/15/19 1121      PT LONG TERM GOAL #1   Title  Pt will decrease her COREFO score from 16 % to <8 %  ( 02/15/19:  13%)    Time  10    Period  Weeks    Status  Partially Met      PT LONG TERM GOAL #2   Title  Pt will decrease her use of suppositories from 2x week to < 1x week and have improved stool consistency from Type 3 to 4 across 75% of the time.     Time  10    Period  Weeks    Status  Achieved      PT LONG TERM  GOAL #3   Title  Pt will report decreased pelvic pain near the tailbone at its worst 4/10 to < 2/10 with bending and when turning over in bed     Time  6    Status  Achieved      PT LONG TERM GOAL #4   Title  Pt will increase her water intake from 16 fl oz to 48 floz per day in order to stay hydrated and have less hard stools     Time  3    Period  Weeks    Status  On-going      PT LONG TERM GOAL #5   Title  Pt will demo no hypomobility on L SIJ across 2 weeks in order to regain optimal pelvic floor lengthening for bowel movements     Time  2    Period  Weeks    Status  Achieved      PT LONG TERM GOAL #6   Title  Pt will report decreased pelvic pain and migraines by 50% in intensity  across one month  in order to participate in activities     Time  4    Period  Weeks    Status  Achieved            Plan - 02/15/19 1127    Clinical Impression Statement  Pt is making great progress with improved LBP and neck pain by 80% as her scoliosis and forward head has been addressed.  Pt continues to require motivational interviewing to build compliance with water intake. Pt had  increased bowel movements without reliance of suppository two weeks ago but only one last week with dependence on suppository.  Applied more manual Tx to decrease abdominal scar restrictions. Plan to communicate with PCP about referral to get colonoscopy as a baseline measure. Pt continues to work on increasing her fiber intake. Pt continues to benefit from skilled PT.      Personal Factors and Comorbidities  Age    Examination-Activity Limitations  Bend;Toileting    Rehab Potential  Good    PT Frequency  1x / week    PT Duration  Other (comment)   10   PT Treatment/Interventions  Therapeutic activities;Functional mobility training;Stair training;Balance training;Neuromuscular re-education;Patient/family education;Manual techniques;Moist Heat;Scar mobilization;Therapeutic exercise    Consulted and Agree with Plan of Care  Patient       Patient will benefit from skilled therapeutic intervention in order to improve the following deficits and impairments:  Abnormal gait, Difficulty walking, Decreased strength, Decreased coordination, Decreased activity tolerance, Postural dysfunction, Pain, Improper body mechanics, Increased muscle spasms, Hypomobility, Decreased mobility, Decreased endurance  Visit Diagnosis: 1. Other abnormalities of gait and mobility   2. Muscle weakness (generalized)   3. Sacrococcygeal disorders, not elsewhere classified        Problem List There are no active problems to display for this patient.   Autumn Chang  ,PT, DPT, E-RYT  02/15/2019, 12:22 PM  [place] MAIN [facility] SERVICES [address] [place], [place], [address] Phone: [phone]   Fax:  [phone]  Name: Autumn Chang MRN: [id number] Date of Birth: [birth year]

## 2019-02-15 NOTE — Patient Instructions (Addendum)
To ensure drink water   _3 bottles placed on the counter  _timer on your phone 8, 10, 12 ,2 ,4pm, 6pm   ______  Make sure you have a variety of green vegetables.    ______  Body scan for relaxation   _____  Colon massage - R lower to upper belly, to upper L and lower L  5 reps   In the morning

## 2019-02-22 ENCOUNTER — Ambulatory Visit: Payer: BLUE CROSS/BLUE SHIELD | Admitting: Physical Therapy

## 2019-03-01 ENCOUNTER — Other Ambulatory Visit: Payer: Self-pay

## 2019-03-01 ENCOUNTER — Ambulatory Visit: Payer: BLUE CROSS/BLUE SHIELD | Admitting: Physical Therapy

## 2019-03-01 DIAGNOSIS — R2689 Other abnormalities of gait and mobility: Secondary | ICD-10-CM

## 2019-03-01 DIAGNOSIS — M533 Sacrococcygeal disorders, not elsewhere classified: Secondary | ICD-10-CM

## 2019-03-01 DIAGNOSIS — M6281 Muscle weakness (generalized): Secondary | ICD-10-CM

## 2019-03-02 NOTE — Therapy (Signed)
[place] MAIN [facility] SERVICES [address] [place], [place], [address] Phone: [phone]   Fax:  [phone]  Physical Therapy Treatment / Discharge Summary   Patient Details  Name: Alan Riles MRN: [id number] Date of Birth: December 03, 1970 Referring Provider (PT): Gilford Raid    Encounter Date: 03/01/2019  PT End of Session - 03/02/19 1000    Visit Number  9    Number of Visits  10    Date for PT Re-Evaluation  03/15/19    PT Start Time  1100    PT Stop Time  3606    PT Time Calculation (min)  57 min    Activity Tolerance  Patient tolerated treatment well;No increased pain    Behavior During Therapy  WFL for tasks assessed/performed       Past Medical History:  Diagnosis Date  . History of kidney stones   . Migraines     Past Surgical History:  Procedure Laterality Date  . [place], 2004   2 c-sections   . HYSTEROSCOPY W/D&C N/A 11/24/2016   Procedure: DILATATION AND CURETTAGE /HYSTEROSCOPY;  Surgeon: Boykin Nearing, MD;  Location: [facility] ORS;  Service: Gynecology;  Laterality: N/A;  . HYSTEROSCOPY WITH NOVASURE N/A 11/24/2016   Procedure: HYSTEROSCOPY WITH NOVASURE;  Surgeon: Boykin Nearing, MD;  Location: [facility] ORS;  Service: Gynecology;  Laterality: N/A;  . toncillectomy    . TONSILLECTOMY      There were no vitals filed for this visit.  Subjective Assessment - 03/01/19 1109    Subjective  Pt did not have to use a suppository for the past 3 weeks. The bowel movements was a normal shape. The following day, pt had vegetables on the grill at her sister's house and afterwards, she had more bowel movements but it was liquid. Pt had shopped for veggies and eating more celery as snacks, eating breakfast more often with granola and yogurt. Pt has been drinking more water and it is helping. Pt has not had any back pain.    Pertinent History  Gynecological Hx: 2 c-sections                       [facility]  Adult PT Treatment/Exercise - 03/02/19 0957      Therapeutic Activites    Therapeutic Activities  --   reassessed goals, provided encouragement with healthier eati     Neuro Re-ed    Neuro Re-ed Details   body scan guided relaxation,      Manual Therapy   Manual therapy comments  scar releases, gentle fascial glides over  lower abdomen /suprapubic                    PT Long Term Goals - 03/01/19 1112      PT LONG TERM GOAL #1   Title  Pt will decrease her COREFO score from 16 % to <8 %  ( 02/15/19:  13%, and 03/01/19:   12% )    Time  10    Period  Weeks    Status  Partially Met      PT LONG TERM GOAL #2   Title  Pt will decrease her use of suppositories from 2x week to < 1x week and have improved stool consistency from Type 3 to 4 across 75% of the time.     Time  10    Period  Weeks    Status  Achieved  PT LONG TERM GOAL #3   Title  Pt will report decreased pelvic pain near the tailbone at its worst 4/10 to < 2/10 with bending and when turning over in bed     Time  6    Status  Achieved      PT LONG TERM GOAL #4   Title  Pt will increase her water intake from 16 fl oz to 48 floz per day in order to stay hydrated and have less hard stools     Time  3    Period  Weeks    Status  Achieved      PT LONG TERM GOAL #5   Title  Pt will demo no hypomobility on L SIJ across 2 weeks in order to regain optimal pelvic floor lengthening for bowel movements     Time  2    Period  Weeks    Status  Achieved      PT LONG TERM GOAL #6   Title  Pt will report decreased pelvic pain and migraines by 50% in intensity  across one month  in order to participate in activities     Time  4    Period  Weeks    Status  Achieved            Plan - 03/02/19 1001    Clinical Impression Statement Across the past 9 visits, pt has achieved 5/6 goals with significantly decreased COREFO score and not needed to use a suppository for bowel movements across  3 weeks. Manual Tx,  stretches, and relaxation training helped to decrease tightness at pelvic floor, abdominal scar adhesions, and spinal mm which helped to improve her mobility and motility.   Pt's low back and pelvic pain resolved as pt's scoliosis and tailbone/ SIJ dysfunction was addressed with manual Tx and specific exercises. Pt has been able to lift heavy objects without issues.  Pt's migraines also improved as pt's posture improved and regained spinal mobility. Pt demo'd proper coordination of deep core mm ( respiratory diaphragm, transverse abdominis, and pelvic floor) and shows increased strength which has contributed to signficant improvement of all of her Sx.   Pt also was educated on general information to improve water, fiber intake and provided motivational interviewing to enhance compliance and self-efficiency. Pt remains compliant. Pt reports her Sx have improved "Very Great Deal BetteR" based on the GROC scale since starting Pelvic PT. Pt is ready for discharge at this time. Pt has been referred to a GI doctor to get her first colonoscopy and has been educated on GI health and prevention.    Personal Factors and Comorbidities  Age    Examination-Activity Limitations  Bend;Toileting    Rehab Potential  Good    PT Frequency  1x / week    PT Duration  Other (comment)   10   PT Treatment/Interventions  Therapeutic activities;Functional mobility training;Stair training;Balance training;Neuromuscular re-education;Patient/family education;Manual techniques;Moist Heat;Scar mobilization;Therapeutic exercise    Consulted and Agree with Plan of Care  Patient       Patient will benefit from skilled therapeutic intervention in order to improve the following deficits and impairments:  Abnormal gait, Difficulty walking, Decreased strength, Decreased coordination, Decreased activity tolerance, Postural dysfunction, Pain, Improper body mechanics, Increased muscle spasms, Hypomobility, Decreased mobility, Decreased  endurance  Visit Diagnosis: No diagnosis found.     Problem List There are no active problems to display for this patient.   Autumn Chang ,PT, DPT, E-RYT  03/02/2019, 10:05 AM  [place] MAIN [facility] SERVICES [address] [place], [place], [address] Phone: [phone]   Fax:  [phone]  Name: Margarethe Virgen MRN: [id number] Date of Birth: [birth year]

## 2019-07-14 ENCOUNTER — Other Ambulatory Visit: Payer: Self-pay | Admitting: Family Medicine

## 2019-07-14 ENCOUNTER — Other Ambulatory Visit: Payer: Self-pay | Admitting: Obstetrics and Gynecology

## 2019-07-14 DIAGNOSIS — Z1231 Encounter for screening mammogram for malignant neoplasm of breast: Secondary | ICD-10-CM

## 2019-10-31 ENCOUNTER — Ambulatory Visit
Admission: RE | Admit: 2019-10-31 | Discharge: 2019-10-31 | Disposition: A | Discharge status: Home / Self Care | Payer: BLUE CROSS/BLUE SHIELD | Source: Ambulatory Visit | Attending: Obstetrics and Gynecology | Admitting: Obstetrics and Gynecology

## 2019-10-31 DIAGNOSIS — Z1231 Encounter for screening mammogram for malignant neoplasm of breast: Secondary | ICD-10-CM | POA: Insufficient documentation

## 2020-07-10 ENCOUNTER — Ambulatory Visit: Payer: Self-pay | Admitting: Surgery

## 2020-07-10 NOTE — H&P (Signed)
Subjective:   CC: Chest wall abscess [L02.213]  HPI:  Autumn Chang is a 49 y.o. female who was returns for evaluation of above. Area resolved after last visit, but had increased swelling and discharge again.   Past Medical History:  has a past medical history of Abnormal uterine bleeding, History of kidney stones, History of migraine headaches, Normocytic anemia (Hgb 11.5 - 07/02/15), Obesity (BMI 30.0-34.9), unspecified, and Pure hypercholesterolemia (LDL 138 - 08/18/16) - diet controlled.  Past Surgical History:  has a past surgical history that includes Cesarean section; Tonsillectomy; and Endometrial ablation w/ novasure (11/24/2016).  Family History: family history includes Breast cancer (age of onset: 67) in her maternal aunt; Endometriosis in her maternal aunt and mother; High blood pressure (Hypertension) in her mother; Hyperlipidemia (Elevated cholesterol) in her mother; No Known Problems in her sister; Pneumonia in her maternal aunt.  Social History:  reports that she has never smoked. She has never used smokeless tobacco. She reports that she does not drink alcohol and does not use drugs.  Current Medications: has a current medication list which includes the following prescription(s): levonorgestrel-ethinyl estradiol, naproxen, red yeast rice, and sumatriptan.  Allergies:  No Known Allergies  ROS:  A 15 point review of systems was performed and pertinent positives and negatives noted in HPI   Objective:     BP 112/77   Pulse 76   Ht 170.2 cm (5\' 7" )   Wt 90.7 kg (200 lb)   BMI 31.32 kg/m   Constitutional :  alert, appears stated age, cooperative and no distress  Lymphatics/Throat:  no asymmetry, masses, or scars  Respiratory:  clear to auscultation bilaterally  Cardiovascular:  regular rate and rhythm  Gastrointestinal: soft, non-tender; bowel sounds normal; no masses,  no organomegaly.    Musculoskeletal: Steady gait and movement  Skin: Cool and moist, left mammary  fold with infected and drained epidermal cyst, with open ulceration at apex with visible cavity, measuring 2cm x 2cm x 1cm deep.  No residual purulence noted within cavity.  Visible erythema as well, no TTP.  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 Chest wall abscess [L02.213] Discussed surgical excision.  Alternatives include continued observation.  Benefits include possible symptom relief, pathologic evaluation, improved cosmesis. Discussed the risk of surgery including recurrence, chronic pain, post-op infxn, poor cosmesis, poor/delayed wound healing, and possible re-operation to address said risks. The risks of general anesthetic, if used, includes MI, CVA, sudden death or even reaction to anesthetic medications also discussed.   The patient verbalized understanding and all questions were answered to the patient's satisfaction.  2. Patient has elected to proceed with surgical treatment, with formal excision and debridement of area in OR due to recurrent nature.  Procedure will be scheduled.  Written consent was obtained.Marland Kitchen

## 2020-07-11 ENCOUNTER — Other Ambulatory Visit: Admission: RE | Admit: 2020-07-11 | Payer: BLUE CROSS/BLUE SHIELD | Source: Ambulatory Visit

## 2020-07-12 ENCOUNTER — Other Ambulatory Visit: Admission: RE | Admit: 2020-07-12 | Payer: BLUE CROSS/BLUE SHIELD | Source: Ambulatory Visit

## 2020-07-13 ENCOUNTER — Encounter: Admission: RE | Payer: Self-pay | Source: Home / Self Care

## 2020-07-13 ENCOUNTER — Ambulatory Visit: Admission: RE | Admit: 2020-07-13 | Payer: BLUE CROSS/BLUE SHIELD | Source: Home / Self Care | Admitting: Surgery

## 2020-07-13 SURGERY — INCISION AND DRAINAGE, ABSCESS
Anesthesia: General | Site: Chest | Laterality: Left

## 2020-09-13 ENCOUNTER — Other Ambulatory Visit: Payer: Self-pay | Admitting: Family Medicine

## 2020-09-13 ENCOUNTER — Other Ambulatory Visit: Payer: Self-pay | Admitting: Obstetrics and Gynecology

## 2020-09-13 DIAGNOSIS — Z1231 Encounter for screening mammogram for malignant neoplasm of breast: Secondary | ICD-10-CM

## 2020-10-31 ENCOUNTER — Ambulatory Visit
Admission: RE | Admit: 2020-10-31 | Discharge: 2020-10-31 | Disposition: A | Discharge status: Home / Self Care | Payer: BLUE CROSS/BLUE SHIELD | Source: Ambulatory Visit | Attending: Family Medicine | Admitting: Family Medicine

## 2020-10-31 ENCOUNTER — Other Ambulatory Visit: Payer: Self-pay

## 2020-10-31 DIAGNOSIS — Z1231 Encounter for screening mammogram for malignant neoplasm of breast: Secondary | ICD-10-CM

## 2021-10-07 ENCOUNTER — Other Ambulatory Visit: Payer: Self-pay | Admitting: Family Medicine

## 2021-10-07 DIAGNOSIS — Z1231 Encounter for screening mammogram for malignant neoplasm of breast: Secondary | ICD-10-CM

## 2021-11-12 ENCOUNTER — Ambulatory Visit
Admission: RE | Admit: 2021-11-12 | Discharge: 2021-11-12 | Disposition: A | Discharge status: Home / Self Care | Payer: 59 | Source: Ambulatory Visit | Attending: Family Medicine | Admitting: Family Medicine

## 2021-11-12 DIAGNOSIS — Z1231 Encounter for screening mammogram for malignant neoplasm of breast: Secondary | ICD-10-CM | POA: Insufficient documentation

## 2022-04-24 DIAGNOSIS — R5383 Other fatigue: Secondary | ICD-10-CM | POA: Diagnosis not present

## 2022-04-24 DIAGNOSIS — M791 Myalgia, unspecified site: Secondary | ICD-10-CM | POA: Diagnosis not present

## 2022-04-24 DIAGNOSIS — L659 Nonscarring hair loss, unspecified: Secondary | ICD-10-CM | POA: Diagnosis not present

## 2022-05-05 DIAGNOSIS — R232 Flushing: Secondary | ICD-10-CM | POA: Diagnosis not present

## 2022-05-09 DIAGNOSIS — D518 Other vitamin B12 deficiency anemias: Secondary | ICD-10-CM | POA: Diagnosis not present

## 2022-06-11 DIAGNOSIS — E538 Deficiency of other specified B group vitamins: Secondary | ICD-10-CM | POA: Diagnosis not present

## 2022-06-20 DIAGNOSIS — J029 Acute pharyngitis, unspecified: Secondary | ICD-10-CM | POA: Diagnosis not present

## 2022-06-20 DIAGNOSIS — R051 Acute cough: Secondary | ICD-10-CM | POA: Diagnosis not present

## 2022-06-20 DIAGNOSIS — Z03818 Encounter for observation for suspected exposure to other biological agents ruled out: Secondary | ICD-10-CM | POA: Diagnosis not present

## 2022-07-23 DIAGNOSIS — D518 Other vitamin B12 deficiency anemias: Secondary | ICD-10-CM | POA: Diagnosis not present

## 2022-08-22 DIAGNOSIS — D518 Other vitamin B12 deficiency anemias: Secondary | ICD-10-CM | POA: Diagnosis not present

## 2022-10-13 ENCOUNTER — Other Ambulatory Visit: Payer: Self-pay | Admitting: Family Medicine

## 2022-10-13 DIAGNOSIS — Z1231 Encounter for screening mammogram for malignant neoplasm of breast: Secondary | ICD-10-CM

## 2022-11-27 ENCOUNTER — Ambulatory Visit
Admission: RE | Admit: 2022-11-27 | Discharge: 2022-11-27 | Disposition: A | Discharge status: Home / Self Care | Payer: Medicaid Other | Source: Ambulatory Visit | Attending: Family Medicine | Admitting: Family Medicine

## 2022-11-27 DIAGNOSIS — Z1231 Encounter for screening mammogram for malignant neoplasm of breast: Secondary | ICD-10-CM | POA: Insufficient documentation

## 2023-09-01 ENCOUNTER — Other Ambulatory Visit: Payer: Self-pay | Admitting: Family Medicine

## 2023-09-01 DIAGNOSIS — Z1231 Encounter for screening mammogram for malignant neoplasm of breast: Secondary | ICD-10-CM

## 2023-12-01 ENCOUNTER — Ambulatory Visit
Admission: RE | Admit: 2023-12-01 | Discharge: 2023-12-01 | Disposition: A | Discharge status: Home / Self Care | Payer: Medicaid Other | Source: Ambulatory Visit | Attending: Family Medicine | Admitting: Family Medicine

## 2023-12-01 DIAGNOSIS — Z1231 Encounter for screening mammogram for malignant neoplasm of breast: Secondary | ICD-10-CM | POA: Insufficient documentation

## 2024-05-13 ENCOUNTER — Encounter
